# Patient Record
Sex: Male | Born: 1945
Health system: Southern US, Community
[De-identification: ages and names within clinical notes are randomized; demographics above are authoritative.]

## PROBLEM LIST (undated history)

## (undated) DIAGNOSIS — J45909 Unspecified asthma, uncomplicated: Secondary | ICD-10-CM

## (undated) DIAGNOSIS — C61 Malignant neoplasm of prostate: Secondary | ICD-10-CM

## (undated) DIAGNOSIS — M1990 Unspecified osteoarthritis, unspecified site: Secondary | ICD-10-CM

## (undated) DIAGNOSIS — J449 Chronic obstructive pulmonary disease, unspecified: Secondary | ICD-10-CM

## (undated) DIAGNOSIS — R0602 Shortness of breath: Secondary | ICD-10-CM

## (undated) DIAGNOSIS — Z923 Personal history of irradiation: Secondary | ICD-10-CM

## (undated) HISTORY — PX: OTHER SURGICAL HISTORY: SHX169

---
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
## 2012-01-04 ENCOUNTER — Other Ambulatory Visit (HOSPITAL_COMMUNITY): Payer: Self-pay | Admitting: Urology

## 2012-01-04 DIAGNOSIS — C61 Malignant neoplasm of prostate: Secondary | ICD-10-CM

## 2012-01-12 ENCOUNTER — Encounter (HOSPITAL_COMMUNITY): Payer: Self-pay

## 2012-01-12 ENCOUNTER — Encounter (HOSPITAL_COMMUNITY)
Admission: RE | Admit: 2012-01-12 | Discharge: 2012-01-12 | Disposition: A | Discharge status: Home / Self Care | Payer: Medicare Other | Source: Ambulatory Visit | Attending: Urology | Admitting: Urology

## 2012-01-12 DIAGNOSIS — C61 Malignant neoplasm of prostate: Secondary | ICD-10-CM | POA: Insufficient documentation

## 2012-01-12 HISTORY — DX: Malignant neoplasm of prostate: C61

## 2012-01-12 MED ORDER — TECHNETIUM TC 99M MEDRONATE IV KIT
24.0000 | PACK | Freq: Once | INTRAVENOUS | Status: AC | PRN
  Administered 2012-01-12: 24 via INTRAVENOUS

## 2012-02-08 ENCOUNTER — Other Ambulatory Visit: Payer: Self-pay | Admitting: Urology

## 2012-02-29 ENCOUNTER — Encounter (HOSPITAL_COMMUNITY): Payer: Self-pay | Admitting: Pharmacy Technician

## 2012-03-05 ENCOUNTER — Encounter (HOSPITAL_COMMUNITY): Payer: Self-pay

## 2012-03-05 ENCOUNTER — Encounter (HOSPITAL_COMMUNITY)
Admission: RE | Admit: 2012-03-05 | Discharge: 2012-03-05 | Disposition: A | Discharge status: Home / Self Care | Payer: Medicare Other | Source: Ambulatory Visit | Attending: Urology | Admitting: Urology

## 2012-03-05 ENCOUNTER — Ambulatory Visit (HOSPITAL_COMMUNITY)
Admission: RE | Admit: 2012-03-05 | Discharge: 2012-03-05 | Disposition: A | Discharge status: Home / Self Care | Payer: Medicare Other | Source: Ambulatory Visit | Attending: Urology | Admitting: Urology

## 2012-03-05 DIAGNOSIS — Z01812 Encounter for preprocedural laboratory examination: Secondary | ICD-10-CM | POA: Insufficient documentation

## 2012-03-05 DIAGNOSIS — Z01818 Encounter for other preprocedural examination: Secondary | ICD-10-CM | POA: Insufficient documentation

## 2012-03-05 DIAGNOSIS — Z87891 Personal history of nicotine dependence: Secondary | ICD-10-CM | POA: Insufficient documentation

## 2012-03-05 HISTORY — DX: Shortness of breath: R06.02

## 2012-03-05 HISTORY — DX: Chronic obstructive pulmonary disease, unspecified: J44.9

## 2012-03-05 HISTORY — DX: Unspecified asthma, uncomplicated: J45.909

## 2012-03-05 LAB — CBC
HCT: 44 % (ref 39.0–52.0)
Hemoglobin: 15.3 g/dL (ref 13.0–17.0)
MCHC: 34.8 g/dL (ref 30.0–36.0)
MCV: 87.8 fL (ref 78.0–100.0)

## 2012-03-05 LAB — BASIC METABOLIC PANEL
BUN: 19 mg/dL (ref 6–23)
Chloride: 103 mEq/L (ref 96–112)
Creatinine, Ser: 0.9 mg/dL (ref 0.50–1.35)
GFR calc non Af Amer: 87 mL/min — ABNORMAL LOW (ref >90)
Glucose, Bld: 105 mg/dL — ABNORMAL HIGH (ref 70–99)
Potassium: 4 mEq/L (ref 3.5–5.1)

## 2012-03-05 LAB — SURGICAL PCR SCREEN: Staphylococcus aureus: NEGATIVE

## 2012-03-05 NOTE — Progress Notes (Signed)
PT AWARE SURGERY CHANGED TO 03-06-12, NPO AFTER MIDNIGHT ARRIVE 520AM SHORT STAY

## 2012-03-05 NOTE — Patient Instructions (Signed)
YOUR SURGERY IS SCHEDULED AT [facility]  ON:  Friday  10/25  AT 1:00 PM  REPORT TO [place] SHORT STAY CENTER AT:  10:30 AM      PHONE # FOR SHORT STAY IS [phone]  FOLLOW BOWEL PREP INSTRUCTIONS FROM DR. GRAPEY'S OFFICE--DAY BEFORE YOUR SURGERY.  DO NOT EAT  ANYTHING AFTER MIDNIGHT THE NIGHT BEFORE YOUR SURGERY.  NO FOOD, NO CHEWING GUM, NO MINTS, NO CANDIES, NO CHEWING TOBACCO.  YOU MAY HAVE CLEAR LIQUIDS TO DRINK FROM MIDNIGHT THE NIGHT BEFORE YOUR SURGERY UNTIL 7:00 AM DAY OF SURGERY--SUCH AS WATER, COFFEE-NO MILK OR CREAM, CRANBERRY JUICE.         NOTHING TO DRINK AFTER 7:00 AM DAY OF SURGERY!!!!!!!!!!!  PLEASE TAKE THE FOLLOWING MEDICATIONS THE AM OF YOUR SURGERY WITH A FEW SIPS OF WATER:  USE SYMBICORT AND ALBUTEROL INHALERS AND BRING ALBUTEROL TO TAKE TO SURGERY.   IF YOU USE INHALERS--USE YOUR INHALERS THE AM OF YOUR SURGERY AND BRING INHALERS TO THE HOSPITAL -TAKE TO SURGERY.    IF YOU ARE DIABETIC:  DO NOT TAKE ANY DIABETIC MEDICATIONS THE AM OF YOUR SURGERY.  IF YOU TAKE INSULIN IN THE EVENINGS--PLEASE ONLY TAKE 1/2 NORMAL EVENING DOSE THE NIGHT BEFORE YOUR SURGERY.  NO INSULIN THE AM OF YOUR SURGERY.  IF YOU HAVE SLEEP APNEA AND USE CPAP OR BIPAP--PLEASE BRING THE MASK AND THE TUBING.  DO NOT BRING YOUR MACHINE.  DO NOT BRING VALUABLES, MONEY, CREDIT CARDS.  DO NOT WEAR JEWELRY, MAKE-UP, NAIL POLISH AND NO METAL PINS OR CLIPS IN YOUR HAIR. CONTACT LENS, DENTURES / PARTIALS, GLASSES SHOULD NOT BE WORN TO SURGERY AND IN MOST CASES-HEARING AIDS WILL NEED TO BE REMOVED.  BRING YOUR GLASSES CASE, ANY EQUIPMENT NEEDED FOR YOUR CONTACT LENS. FOR PATIENTS ADMITTED TO THE HOSPITAL--CHECK OUT TIME THE DAY OF DISCHARGE IS 11:00 AM.  ALL INPATIENT ROOMS ARE PRIVATE - WITH BATHROOM, TELEPHONE, TELEVISION AND WIFI INTERNET.  IF YOU ARE BEING DISCHARGED THE SAME DAY OF YOUR SURGERY--YOU CAN NOT DRIVE YOURSELF HOME--AND SHOULD NOT GO HOME ALONE BY TAXI OR BUS.  NO DRIVING OR  OPERATING MACHINERY FOR 24 HOURS FOLLOWING ANESTHESIA / PAIN MEDICATIONS.  PLEASE MAKE ARRANGEMENTS FOR SOMEONE TO BE WITH YOU AT HOME THE FIRST 24 HOURS AFTER SURGERY. RESPONSIBLE DRIVER'S NAME___________________________                                               PHONE #   _______________________                                  PLEASE READ OVER ANY  FACT SHEETS THAT YOU WERE GIVEN: MRSA INFORMATION, BLOOD TRANSFUSION INFORMATION, INCENTIVE SPIROMETER INFORMATION.

## 2012-03-05 NOTE — Pre-Procedure Instructions (Signed)
CBC, BMET, T/S, EKG AND CXR WERE DONE PREOP TODAY AT [facility] AS PER ORDERS DR. Isabel Caprice AND ANESTHESIOLOGIST'S GUIDELINES.

## 2012-03-06 ENCOUNTER — Encounter (HOSPITAL_COMMUNITY)
Admission: RE | Disposition: A | Discharge status: Home / Self Care | Payer: Self-pay | Source: Ambulatory Visit | Attending: Urology

## 2012-03-06 ENCOUNTER — Inpatient Hospital Stay (HOSPITAL_COMMUNITY): Payer: Medicare Other | Admitting: Anesthesiology

## 2012-03-06 ENCOUNTER — Observation Stay (HOSPITAL_COMMUNITY)
Admission: RE | Admit: 2012-03-06 | Discharge: 2012-03-07 | Disposition: A | Discharge status: Home / Self Care | Payer: Medicare Other | Source: Ambulatory Visit | Attending: Urology | Admitting: Urology

## 2012-03-06 ENCOUNTER — Encounter (HOSPITAL_COMMUNITY): Payer: Self-pay | Admitting: *Deleted

## 2012-03-06 ENCOUNTER — Encounter (HOSPITAL_COMMUNITY): Payer: Self-pay | Admitting: Anesthesiology

## 2012-03-06 DIAGNOSIS — Z8042 Family history of malignant neoplasm of prostate: Secondary | ICD-10-CM | POA: Insufficient documentation

## 2012-03-06 DIAGNOSIS — J45909 Unspecified asthma, uncomplicated: Secondary | ICD-10-CM | POA: Insufficient documentation

## 2012-03-06 DIAGNOSIS — Z79899 Other long term (current) drug therapy: Secondary | ICD-10-CM | POA: Insufficient documentation

## 2012-03-06 DIAGNOSIS — C61 Malignant neoplasm of prostate: Principal | ICD-10-CM | POA: Insufficient documentation

## 2012-03-06 DIAGNOSIS — IMO0002 Reserved for concepts with insufficient information to code with codable children: Secondary | ICD-10-CM | POA: Insufficient documentation

## 2012-03-06 DIAGNOSIS — Z01812 Encounter for preprocedural laboratory examination: Secondary | ICD-10-CM | POA: Insufficient documentation

## 2012-03-06 HISTORY — PX: ROBOT ASSISTED LAPAROSCOPIC RADICAL PROSTATECTOMY: SHX5141

## 2012-03-06 HISTORY — PX: CYSTOSCOPY: SHX5120

## 2012-03-06 LAB — TYPE AND SCREEN
ABO/RH(D): O POS
Antibody Screen: NEGATIVE

## 2012-03-06 SURGERY — ROBOTIC ASSISTED LAPAROSCOPIC RADICAL PROSTATECTOMY
Anesthesia: General | Wound class: Clean Contaminated

## 2012-03-06 MED ORDER — ONDANSETRON HCL 4 MG/2ML IJ SOLN
INTRAMUSCULAR | Status: DC | PRN
  Administered 2012-03-06: 4 mg via INTRAVENOUS

## 2012-03-06 MED ORDER — STERILE WATER FOR IRRIGATION IR SOLN
Status: DC | PRN
  Administered 2012-03-06: 3000 mL

## 2012-03-06 MED ORDER — CEFAZOLIN SODIUM-DEXTROSE 2-3 GM-% IV SOLR
2.0000 g | INTRAVENOUS | Status: AC
  Administered 2012-03-06: 2 g via INTRAVENOUS

## 2012-03-06 MED ORDER — PROPOFOL 10 MG/ML IV BOLUS
INTRAVENOUS | Status: DC | PRN
  Administered 2012-03-06: 200 mg via INTRAVENOUS

## 2012-03-06 MED ORDER — GLYCOPYRROLATE 0.2 MG/ML IJ SOLN
INTRAMUSCULAR | Status: DC | PRN
  Administered 2012-03-06: 0.4 mg via INTRAVENOUS

## 2012-03-06 MED ORDER — ACETAMINOPHEN 10 MG/ML IV SOLN
INTRAVENOUS | Status: AC
  Filled 2012-03-06: qty 100

## 2012-03-06 MED ORDER — HEPARIN SODIUM (PORCINE) 1000 UNIT/ML IJ SOLN
INTRAMUSCULAR | Status: AC
  Filled 2012-03-06: qty 1

## 2012-03-06 MED ORDER — SODIUM CHLORIDE 0.9 % IV SOLN
INTRAVENOUS | Status: DC | PRN
  Administered 2012-03-06: 12:00:00 via INTRAVENOUS

## 2012-03-06 MED ORDER — ROCURONIUM BROMIDE 100 MG/10ML IV SOLN
INTRAVENOUS | Status: DC | PRN
  Administered 2012-03-06: 20 mg via INTRAVENOUS
  Administered 2012-03-06: 50 mg via INTRAVENOUS
  Administered 2012-03-06: 10 mg via INTRAVENOUS
  Administered 2012-03-06: 20 mg via INTRAVENOUS

## 2012-03-06 MED ORDER — LACTATED RINGERS IV SOLN
INTRAVENOUS | Status: DC | PRN
  Administered 2012-03-06: 10:00:00

## 2012-03-06 MED ORDER — ACETAMINOPHEN 325 MG PO TABS: 650.0000 mg | ORAL_TABLET | Freq: Four times a day (QID) | ORAL | Status: DC | PRN

## 2012-03-06 MED ORDER — HYDROCODONE-ACETAMINOPHEN 5-325 MG PO TABS
1.0000 | ORAL_TABLET | ORAL | Status: DC | PRN
Start: 2012-03-06 — End: 2012-03-07
  Administered 2012-03-06: 2 via ORAL
  Administered 2012-03-06: 1 via ORAL
  Filled 2012-03-06: qty 1
  Filled 2012-03-06: qty 2

## 2012-03-06 MED ORDER — CIPROFLOXACIN HCL 500 MG PO TABS: 500.0000 mg | ORAL_TABLET | Freq: Two times a day (BID) | ORAL | Status: DC

## 2012-03-06 MED ORDER — BUPIVACAINE-EPINEPHRINE PF 0.25-1:200000 % IJ SOLN
INTRAMUSCULAR | Status: AC
  Filled 2012-03-06: qty 30

## 2012-03-06 MED ORDER — BUDESONIDE-FORMOTEROL FUMARATE 160-4.5 MCG/ACT IN AERO
2.0000 | INHALATION_SPRAY | Freq: Two times a day (BID) | RESPIRATORY_TRACT | Status: DC
  Administered 2012-03-06 – 2012-03-07 (×2): 2 via RESPIRATORY_TRACT
  Filled 2012-03-06: qty 6

## 2012-03-06 MED ORDER — ACETAMINOPHEN 10 MG/ML IV SOLN: 1000.0000 mg | Freq: Once | INTRAVENOUS | Status: DC | PRN

## 2012-03-06 MED ORDER — OXYCODONE HCL 5 MG/5ML PO SOLN
5.0000 mg | Freq: Once | ORAL | Status: DC | PRN
  Filled 2012-03-06: qty 5

## 2012-03-06 MED ORDER — ACETAMINOPHEN 10 MG/ML IV SOLN
1000.0000 mg | Freq: Four times a day (QID) | INTRAVENOUS | Status: DC
  Administered 2012-03-06 – 2012-03-07 (×3): 1000 mg via INTRAVENOUS
  Filled 2012-03-06 (×4): qty 100

## 2012-03-06 MED ORDER — ACETAMINOPHEN 10 MG/ML IV SOLN
INTRAVENOUS | Status: DC | PRN
  Administered 2012-03-06: 1000 mg via INTRAVENOUS

## 2012-03-06 MED ORDER — MIDAZOLAM HCL 5 MG/5ML IJ SOLN
INTRAMUSCULAR | Status: DC | PRN
  Administered 2012-03-06: 2 mg via INTRAVENOUS

## 2012-03-06 MED ORDER — ALBUTEROL SULFATE HFA 108 (90 BASE) MCG/ACT IN AERS
2.0000 | INHALATION_SPRAY | Freq: Four times a day (QID) | RESPIRATORY_TRACT | Status: DC | PRN
  Administered 2012-03-06: 2 via RESPIRATORY_TRACT
  Filled 2012-03-06: qty 6.7

## 2012-03-06 MED ORDER — HYDROMORPHONE HCL PF 1 MG/ML IJ SOLN
INTRAMUSCULAR | Status: AC
  Filled 2012-03-06: qty 1

## 2012-03-06 MED ORDER — HYDROMORPHONE HCL PF 1 MG/ML IJ SOLN
0.2500 mg | INTRAMUSCULAR | Status: DC | PRN
  Administered 2012-03-06 (×2): 0.5 mg via INTRAVENOUS

## 2012-03-06 MED ORDER — SODIUM CHLORIDE 0.9 % IV BOLUS (SEPSIS)
1000.0000 mL | Freq: Once | INTRAVENOUS | Status: AC
  Administered 2012-03-06: 1000 mL via INTRAVENOUS

## 2012-03-06 MED ORDER — PROMETHAZINE HCL 25 MG/ML IJ SOLN: 6.2500 mg | INTRAMUSCULAR | Status: DC | PRN

## 2012-03-06 MED ORDER — BUPIVACAINE-EPINEPHRINE 0.25% -1:200000 IJ SOLN
INTRAMUSCULAR | Status: DC | PRN
  Administered 2012-03-06: 28 mL

## 2012-03-06 MED ORDER — NEOSTIGMINE METHYLSULFATE 1 MG/ML IJ SOLN
INTRAMUSCULAR | Status: DC | PRN
  Administered 2012-03-06: 3.5 mg via INTRAVENOUS

## 2012-03-06 MED ORDER — INDIGOTINDISULFONATE SODIUM 8 MG/ML IJ SOLN
INTRAMUSCULAR | Status: AC
  Filled 2012-03-06: qty 10

## 2012-03-06 MED ORDER — KCL IN DEXTROSE-NACL 10-5-0.45 MEQ/L-%-% IV SOLN
INTRAVENOUS | Status: AC
  Filled 2012-03-06: qty 1000

## 2012-03-06 MED ORDER — OXYCODONE HCL 5 MG PO TABS: 5.0000 mg | ORAL_TABLET | Freq: Once | ORAL | Status: DC | PRN

## 2012-03-06 MED ORDER — MEPERIDINE HCL 50 MG/ML IJ SOLN: 6.2500 mg | INTRAMUSCULAR | Status: DC | PRN

## 2012-03-06 MED ORDER — KCL IN DEXTROSE-NACL 10-5-0.45 MEQ/L-%-% IV SOLN
INTRAVENOUS | Status: DC
  Administered 2012-03-06: 21:00:00 via INTRAVENOUS
  Administered 2012-03-06: 1000 mL via INTRAVENOUS
  Filled 2012-03-06 (×5): qty 1000

## 2012-03-06 MED ORDER — INDIGOTINDISULFONATE SODIUM 8 MG/ML IJ SOLN
INTRAMUSCULAR | Status: DC | PRN
  Administered 2012-03-06 (×2): 5 mL via INTRAVENOUS

## 2012-03-06 MED ORDER — FENTANYL CITRATE 0.05 MG/ML IJ SOLN
INTRAMUSCULAR | Status: DC | PRN
  Administered 2012-03-06 (×2): 100 ug via INTRAVENOUS
  Administered 2012-03-06: 150 ug via INTRAVENOUS

## 2012-03-06 MED ORDER — CEFAZOLIN SODIUM-DEXTROSE 2-3 GM-% IV SOLR
INTRAVENOUS | Status: AC
  Filled 2012-03-06: qty 50

## 2012-03-06 MED ORDER — LIDOCAINE HCL (CARDIAC) 20 MG/ML IV SOLN
INTRAVENOUS | Status: DC | PRN
  Administered 2012-03-06: 50 mg via INTRAVENOUS

## 2012-03-06 MED ORDER — STERILE WATER FOR IRRIGATION IR SOLN
Status: DC | PRN
  Administered 2012-03-06: 6000 mL

## 2012-03-06 MED ORDER — HYDROMORPHONE HCL PF 1 MG/ML IJ SOLN
INTRAMUSCULAR | Status: DC | PRN
  Administered 2012-03-06 (×2): 1 mg via INTRAVENOUS

## 2012-03-06 MED ORDER — HYDROCODONE-ACETAMINOPHEN 5-325 MG PO TABS: 1.0000 | ORAL_TABLET | Freq: Four times a day (QID) | ORAL | Status: DC | PRN

## 2012-03-06 MED ORDER — MORPHINE SULFATE 2 MG/ML IJ SOLN: 2.0000 mg | INTRAMUSCULAR | Status: DC | PRN

## 2012-03-06 MED ORDER — LACTATED RINGERS IV SOLN
INTRAVENOUS | Status: DC | PRN
  Administered 2012-03-06 (×2): via INTRAVENOUS

## 2012-03-06 MED ORDER — HEMOSTATIC AGENTS (NO CHARGE) OPTIME
TOPICAL | Status: DC | PRN
  Administered 2012-03-06: 1 via TOPICAL

## 2012-03-06 SURGICAL SUPPLY — 64 items
ADAPTER CATH URET PLST 4-6FR (CATHETERS) IMPLANT
ADPR CATH URET STRL DISP 4-6FR (CATHETERS)
APPLICATOR SURGIFLO ENDO (HEMOSTASIS) ×3 IMPLANT
BAG URO CATCHER STRL LF (DRAPE) IMPLANT
CANISTER SUCTION 2500CC (MISCELLANEOUS) ×3 IMPLANT
CATH FOLEY 2W COUNCIL 5CC 16FR (CATHETERS) ×3 IMPLANT
CATH FOLEY 2WAY 5CC 16FR (CATHETERS) ×2
CATH FOLEY 2WAY SLVR  5CC 20FR (CATHETERS) ×1
CATH FOLEY 2WAY SLVR 18FR 30CC (CATHETERS) ×3 IMPLANT
CATH FOLEY 2WAY SLVR 5CC 20FR (CATHETERS) ×2 IMPLANT
CATH ROBINSON RED A/P 8FR (CATHETERS) ×3 IMPLANT
CATH TIEMANN FOLEY 18FR 5CC (CATHETERS) ×3 IMPLANT
CATH URET 5FR 28IN CONE TIP (BALLOONS)
CATH URET 5FR 70CM CONE TIP (BALLOONS) IMPLANT
CATH URET WHISTLE 5FR 28IN (CATHETERS) IMPLANT
CATH URET WHISTLE 6FR (CATHETERS) IMPLANT
CATH URTH STD 16FR FL 2W DRN (CATHETERS) ×2 IMPLANT
CHLORAPREP W/TINT 26ML (MISCELLANEOUS) ×3 IMPLANT
CLIP LIGATING HEM O LOK PURPLE (MISCELLANEOUS) IMPLANT
CLOTH BEACON ORANGE TIMEOUT ST (SAFETY) ×3 IMPLANT
CORD HIGH FREQUENCY UNIPOLAR (ELECTROSURGICAL) ×3 IMPLANT
COVER SURGICAL LIGHT HANDLE (MISCELLANEOUS) ×3 IMPLANT
COVER TIP SHEARS 8 DVNC (MISCELLANEOUS) ×2 IMPLANT
COVER TIP SHEARS 8MM DA VINCI (MISCELLANEOUS) ×1
CUTTER ECHEON FLEX ENDO 45 340 (ENDOMECHANICALS) ×3 IMPLANT
DECANTER SPIKE VIAL GLASS SM (MISCELLANEOUS) ×3 IMPLANT
DRAPE SURG IRRIG POUCH 19X23 (DRAPES) ×3 IMPLANT
DRAPE UTILITY 15X26 (DRAPE) ×3 IMPLANT
DRSG TEGADERM 2-3/8X2-3/4 SM (GAUZE/BANDAGES/DRESSINGS) ×15 IMPLANT
DRSG TEGADERM 4X4.75 (GAUZE/BANDAGES/DRESSINGS) ×3 IMPLANT
DRSG TEGADERM 6X8 (GAUZE/BANDAGES/DRESSINGS) ×6 IMPLANT
ELECT REM PT RETURN 9FT ADLT (ELECTROSURGICAL) ×3
ELECTRODE REM PT RTRN 9FT ADLT (ELECTROSURGICAL) ×2 IMPLANT
GLOVE BIO SURGEON STRL SZ 6.5 (GLOVE) ×6 IMPLANT
GLOVE BIOGEL M STRL SZ7.5 (GLOVE) ×3 IMPLANT
GOWN PREVENTION PLUS XLARGE (GOWN DISPOSABLE) ×6 IMPLANT
GOWN STRL NON-REIN LRG LVL3 (GOWN DISPOSABLE) ×9 IMPLANT
GOWN STRL REIN XL XLG (GOWN DISPOSABLE) ×3 IMPLANT
GUIDEWIRE STR DUAL SENSOR (WIRE) ×6 IMPLANT
HEMOSTAT SURGICEL 4X8 (HEMOSTASIS) ×3 IMPLANT
HOLDER FOLEY CATH W/STRAP (MISCELLANEOUS) ×3 IMPLANT
IV LACTATED RINGERS 1000ML (IV SOLUTION) IMPLANT
KIT ACCESSORY DA VINCI DISP (KITS) ×1
KIT ACCESSORY DVNC DISP (KITS) ×2 IMPLANT
MANIFOLD NEPTUNE II (INSTRUMENTS) IMPLANT
NDL SAFETY ECLIPSE 18X1.5 (NEEDLE) ×2 IMPLANT
NEEDLE HYPO 18GX1.5 SHARP (NEEDLE) ×1
PACK CYSTO (CUSTOM PROCEDURE TRAY) IMPLANT
PACK ROBOT UROLOGY CUSTOM (CUSTOM PROCEDURE TRAY) ×3 IMPLANT
RELOAD GREEN ECHELON 45 (STAPLE) ×3 IMPLANT
SEALER TISSUE G2 CVD JAW 45CM (ENDOMECHANICALS) ×3 IMPLANT
SET CYSTO W/LG BORE CLAMP LF (SET/KITS/TRAYS/PACK) ×6 IMPLANT
SET TUBE IRRIG SUCTION NO TIP (IRRIGATION / IRRIGATOR) ×3 IMPLANT
SOLUTION ELECTROLUBE (MISCELLANEOUS) ×3 IMPLANT
SPONGE GAUZE 4X4 12PLY (GAUZE/BANDAGES/DRESSINGS) ×3 IMPLANT
SURGIFLO W/THROMBIN 8M KIT (HEMOSTASIS) ×3 IMPLANT
SUT VIC AB 2-0 SH 27 (SUTURE) ×1
SUT VIC AB 2-0 SH 27X BRD (SUTURE) ×2 IMPLANT
SUT VICRYL 0 UR6 27IN ABS (SUTURE) ×3 IMPLANT
SYR 27GX1/2 1ML LL SAFETY (SYRINGE) ×3 IMPLANT
TOWEL OR NON WOVEN STRL DISP B (DISPOSABLE) ×3 IMPLANT
TUBING CONNECTING 10 (TUBING) IMPLANT
WATER STERILE IRR 1500ML POUR (IV SOLUTION) IMPLANT
WIRE COONS/BENSON .038X145CM (WIRE) IMPLANT

## 2012-03-06 NOTE — Anesthesia Preprocedure Evaluation (Addendum)
Anesthesia Evaluation  Patient identified by MRN, date of birth, ID band Patient awake    Reviewed: Allergy & Precautions, H&P , NPO status , Patient's Chart, lab work & pertinent test results  Airway Mallampati: I TM Distance: >3 FB Neck ROM: Full    Dental  (+) Dental Advisory Given and Teeth Intact   Pulmonary shortness of breath and with exertion, asthma , COPD COPD inhaler, former smoker,  breath sounds clear to auscultation  Pulmonary exam normal       Cardiovascular - CAD and - Past MI Rhythm:Regular Rate:Normal     Neuro/Psych negative neurological ROS  negative psych ROS   GI/Hepatic Neg liver ROS,   Endo/Other  negative endocrine ROS  Renal/GU negative Renal ROS     Musculoskeletal negative musculoskeletal ROS (+)   Abdominal   Peds  Hematology negative hematology ROS (+)   Anesthesia Other Findings   Reproductive/Obstetrics                         Anesthesia Physical Anesthesia Plan  ASA: III  Anesthesia Plan: General   Post-op Pain Management:    Induction: Intravenous  Airway Management Planned: Oral ETT  Additional Equipment:   Intra-op Plan:   Post-operative Plan:   Informed Consent: I have reviewed the patients History and Physical, chart, labs and discussed the procedure including the risks, benefits and alternatives for the proposed anesthesia with the patient or authorized representative who has indicated his/her understanding and acceptance.   Dental advisory given  Plan Discussed with: CRNA  Anesthesia Plan Comments:         Anesthesia Quick Evaluation

## 2012-03-06 NOTE — Op Note (Signed)
Preoperative diagnosis: Clinical stage T1c Adenocarcinoma prostate  Postoperative diagnosis: Same, urethral stricture disease Procedure: Cystoscopy with urethral dilation, Robotic-assisted laparoscopic radical retropubic prostatectomy with bilateral pelvic lymph node dissection  Surgeon: Valetta Fuller, MD  Asst.: Pecola Leisure, PA Anesthesia: Gen. Endotracheal  Indications: Patient was diagnosed with clinical stage TIc Adenocarcinoma the prostate. He underwent extensive consultation with regard to treatment options. The patient decided on a surgical approach. He appeared to understand the distinct advantages as well as the disadvantages of this procedure. The patient has performed a mechanical bowel prep. He has had placement of PAS compression boots and has received perioperative antibiotics. The patient's preoperative PSA was 4-5 range. Ultrasound revealed a 28 g prostate.  The patient had a history of urethral stricture disease but did not have any major voiding complaints. We felt that cystoscopy prior to the procedure to assess the situation would be indicated. Technique and findings:The patient was brought to the operating room and had successful induction of general endotracheal anesthesia.the patient was placed in a low lithotomy position with careful padding of all extremities. He was secured to the operative table and placed in the steep Trendelenburg position. He was prepped and draped in usual manner. We went ahead with flexible cystoscopy. This revealed several areas of narrowing with mild stricture disease within the penile urethra and a moderate stricture of approximately 12 French in the bulbar urethra. A wire was placed into the bladder. Dilators were then utilized over the guidewire. We then eventually passed a 16 [place] council tip Foley catheter into the bladder. A Foley catheter was placed sterilely on the field. Camera port site was chosen 18 cm above the pubic symphysis just to the  left of the umbilicus. A standard open Hassan technique was utilized. A 12 mm trocar was placed without difficulty. The camera was then inserted and no abnormalities were noted within the pelvis. The trochars were placed with direct visual guidance. This included 3 8mm robotic trochars and a 12 mm and 5 mm assist ports. Once all the ports were placed the robot was docked. The bladder was filled and the space of Retzius was developed with electrocautery dissection as well as blunt dissection. Superficial fat off the endopelvic fascia and bladder neck was removed with electrocautery scissors. The endopelvic fascia was then incised bilaterally from base to apex. Levator musculature was swept off the apex of the prostate isolating the dorsal venous complex which was then stapled with the ETS stapling device. The anterior bladder neck was identified with the aid of the Foley balloon. This was then transected down to the Foley catheter with electrocautery scissors. The Foley catheter was then retracted anteriorly. Indigo carmine was given and we appeared to be well away from the ureteral orifices. The posterior bladder neck was then transected and the dissection carried down to the adnexal structures. The seminal vesicles and vas deferens on both sides were then individually dissected free and retracted anteriorly. The posterior plane between the rectum and prostate was then established primarily with blunt dissection.  Attention was then turned towards nerve sparing. The patient was felt to be a candidate for left-sided nerve sparing and limited right-sided nerve sparing. Superficial fascia along the anterior lateral aspect of the prostate was incised bilaterally. This tissue was then swept laterally until we were able to establish a groove between the neurovascular tissue and the posterior lateral aspect on the prostate bilaterally. This groove was then extended from the apex back to the base of the prostate.  With the  prostate retracted anteriorly the vascular pedicles of the prostate were taken with the Enseal device. The Foley catheter was then reinserted and the anterior urethra was transected. The posterior urethra was then transected as were some rectourethralis fibers. The prostate was then removed from the pelvis. The pelvis was then copiously irrigated. Rectal insufflation was performed and there was no evidence of rectal injury.  Attention was then turned towards bilateral pelvic lymph node dissection. The obturator node packets were removed I laterally and the dissection extended towards the bifurcation of the iliac artery. The obturator nerve was identified on both sides and preserved. Hemalock clips were used for small veins and lymphatic channels. The node packets were sent for permanent analysis.  Attention was then turned towards reconstruction. The bladder neck did not require any reconstruction. The bladder neck and posterior urethra were reapproximated at the 6:00 position utilizing a 2-0 Vicryl suture. The rest of the anastomosis was done with a double-armed 3-0 Monocryl suture in a 360 degree manner. Additional indigo carmine was given. The catheter was reinserted into the bladder and bladder irrigation revealed no evidence of leakage. A Blake drain was placed through one of the robotic trochars and positioned in the retropubic space above the anastomosis. This was then secured to the skin with a nylon suture. The prostate was placed in the Endopouch retrieval bag. The 12 mm trocar site was closed with a Vicryl suture with the aid of a suture passer. Our other trochars were taken out with direct visual guidance without evidence of any bleeding. The camera port incision was extended slightly to allow for removal of the specimen and then closed with a running Vicryl suture. All port sites were infiltrated with Marcaine and then closed with surgical clips. The patient was then taken to recovery room having had  no obvious complications or problems. Sponge and needle counts were correct.

## 2012-03-06 NOTE — Preoperative (Signed)
Beta Blockers   Reason not to administer Beta Blockers:Not Applicable 

## 2012-03-06 NOTE — Care Management Note (Unsigned)
    Page 1 of 1   03/06/2012     2:28:01 PM   CARE MANAGEMENT NOTE 03/06/2012  Patient:  Curtis [name]   Account Number:  [id number]  Date Initiated:  03/06/2012  Documentation initiated by:  Lanier Clam  Subjective/Objective Assessment:   ADMITTED W/URETHRAL STRICTURE.     Action/Plan:   FROM HOME W/SPOUSE   Anticipated DC Date:  03/07/2012   Anticipated DC Plan:  HOME/SELF CARE      DC Planning Services  CM consult      Choice offered to / List presented to:             Status of service:  In process, will continue to follow Medicare Important Message given?   (If response is "NO", the following Medicare IM given date fields will be blank) Date Medicare IM given:   Date Additional Medicare IM given:    Discharge Disposition:    Per UR Regulation:  Reviewed for med. necessity/level of care/duration of stay  If discussed at Long Length of Stay Meetings, dates discussed:    Comments:  03/06/12 Curtis Stanger RN,BSN NCM [phone] [name]/P LAP RADICAL PROSTATECTOMY

## 2012-03-06 NOTE — Anesthesia Postprocedure Evaluation (Signed)
Anesthesia Post Note  Patient: Curtis [name]  Procedure(s) Performed: Procedure(s) (LRB): ROBOTIC ASSISTED LAPAROSCOPIC RADICAL PROSTATECTOMY (N/A) LYMPHADENECTOMY (Bilateral) CYSTOSCOPY FLEXIBLE (N/A)  Anesthesia type: General  Patient location: PACU  Post pain: Pain level controlled  Post assessment: Post-op Vital signs reviewed  Last Vitals:  Filed Vitals:   03/06/12 1345  BP: 140/61  Pulse: 52  Temp: 36.3 C  Resp: 13    Post vital signs: Reviewed  Level of consciousness: sedated  Complications: No apparent anesthesia complications

## 2012-03-06 NOTE — OR Nursing (Signed)
Late entry: Jackson-Pratt drain documented. 03/06/2012 @1408 

## 2012-03-06 NOTE — Anesthesia Postprocedure Evaluation (Signed)
Anesthesia Post Note  Patient: Curtis [name]  Procedure(s) Performed: Procedure(s) (LRB): ROBOTIC ASSISTED LAPAROSCOPIC RADICAL PROSTATECTOMY (N/A) LYMPHADENECTOMY (Bilateral) CYSTOSCOPY FLEXIBLE (N/A)  Anesthesia type: General  Patient location: PACU  Post pain: Pain level controlled  Post assessment: Post-op Vital signs reviewed  Last Vitals: BP 111/61  Pulse 59  Temp 36.2 C (Oral)  Resp 12  SpO2 99%  Post vital signs: Reviewed  Level of consciousness: sedated  Complications: No apparent anesthesia complications

## 2012-03-06 NOTE — Transfer of Care (Signed)
Immediate Anesthesia Transfer of Care Note  Patient: Curtis [name]  Procedure(s) Performed: Procedure(s) (LRB) with comments: ROBOTIC ASSISTED LAPAROSCOPIC RADICAL PROSTATECTOMY (N/A) - WITH BILATERAL PELVIC LYMPH NODE DISSECTION LYMPHADENECTOMY (Bilateral) CYSTOSCOPY FLEXIBLE (N/A) - with urethral dialation  Patient Location: PACU  Anesthesia Type: General  Level of Consciousness: awake, alert  and patient cooperative  Airway & Oxygen Therapy: Patient Spontanous Breathing and Patient connected to face mask oxygen  Post-op Assessment: Report given to PACU RN and Post -op Vital signs reviewed and stable  Post vital signs: Reviewed and stable  Complications: No apparent anesthesia complications

## 2012-03-06 NOTE — Progress Notes (Signed)
Pt would only take 1 puff of symbicort.

## 2012-03-06 NOTE — H&P (Signed)
Reason For Visit     Patient is a 66 year old male sent to me to discuss the pros and cons of possible robotic prostatectomy to manage recently diagnosed intermediate risk clinical stage TI C prostate cancer. AUA score =4. SHIM 20/25. No abdominal surgery. History of urethral stricture.   History of Present Illness  Prior history per Dr. Ihor Gully     Adenocarcinoma of the prostate: He was found to have a PSA of 4.18 in 7/11. He had his PSA checked in 3/13 because he was applying for some insurance and was found to be elevated further at 7.35. He has no voiding symptoms and reports he voids with a good stream which is significant because he has had a prior visual internal urethrotomy by Dr. Aldean Ast in the early 90s. At that time he was experiencing some pain in his left testicle. He was having some left testicular pain for about 2 months. TRUS/BX was performed on 12/25/11 do to an elevated PSA of 7.27/5%. This revealed 28 cc prostate. Pathology: Adenocarcinoma 8/12 cores positive. Gleason 4+4 = 8 in one core, 4+3 = 7 in 2 cores and 3+4 = 7 in the remainder. Stage: cT1c Metastatic workup: His bone scan and CT scan of the pelvis were negative for evidence of metastatic disease.   Interval history: He is doing well and came in today to go over the results of his metastatic workup.   Past Medical History Problems  1. History of  Asthma 493.90  Surgical History Problems  1. History of  Biopsy Of The Prostate Needle 2. History of  Dilation Of Male Urethral Stricture 3. History of  Primary Repair Of Ruptured Achilles Tendon Right  Current Meds 1. Albuterol Sulfate NEBU; Therapy: (Recorded:22Aug2013) to 2. Aspirin 325 MG Oral Tablet; Therapy: (Recorded:25Jun2013) to 3. ProAir HFA AERS; Therapy: (Recorded:25Jun2013) to 4. Symbicort 160-4.5 MCG/ACT Inhalation Aerosol; Therapy: (Recorded:25Jun2013) to  Allergies Medication  1. Penicillins  Family History Problems  1. Family history  of  Prostate Cancer V16.42  Social History Problems  1. Alcohol Use 2. Caffeine Use 3. Former Smoker V15.82 1/2ppdx20 years ago quit 15 years ago around 1998  Review of Systems  Respiratory: shortness of breath.  Musculoskeletal: joint pain.    Vitals Vital Signs [Data Includes: Last 1 Day]  24Sep2013 04:22PM  Blood Pressure: 124 / 76 Temperature: 98 F Heart Rate: 66  Physical Exam Constitutional: Well nourished and well developed . No acute distress.  Pulmonary: No respiratory distress and normal respiratory rhythm and effort.  Cardiovascular: Heart rate and rhythm are normal . No peripheral edema.  Abdomen: The abdomen is soft and nontender. No masses are palpated. No CVA tenderness. No hernias are palpable. No hepatosplenomegaly noted.  Neuro/Psych:. Mood and affect are appropriate.    Assessment Assessed  1. Adenocarcinoma Of The Prostate Gland 185  Plan Adenocarcinoma Of The Prostate Gland (185)  1. Follow-up Schedule Surgery Office  Follow-up  Requested for: 24Sep2013  Discussion/Summary  A total of 50 minutes were spent in the overall care of the patient today with 45 minutes in direct face to face consultation.   The patient was counseled about the natural history of prostate cancer and the standard treatment options that are available for prostate cancer. It was explained to him how his age and life expectancy, clinical stage, Gleason score, and PSA affect his prognosis, the decision to proceed with additional staging studies, as well as how that information influences recommended treatment strategies. We discussed the roles for active  surveillance, radiation therapy, surgical therapy, androgen deprivation, as well as ablative therapy options for the treatment of prostate cancer as appropriate to his individual cancer situation. We discussed the risks and benefits of these options with regard to their impact on cancer control and also in terms of potential adverse  events, complications, and impact on quiality of life particularly related to urinary, bowel, and sexual function. The patient was encouraged to ask questions throughout the discussion today and all questions were answered to his stated satisfaction. In addition, the patient was provided with and/or directed to appropriate resources and literature for further education about prostate cancer and treatment options.   We discussed surgical therapy for prostate cancer including the different available surgical approaches. We discussed, in detail, the risks and expectations of surgery with regard to cancer control, urinary control, and erectile function as well as the expected postoperative recovery process. The risks, potential complications/adverse events of radical prostatectomy as well as alternative options were explained to the patient.   We discussed surgical therapy for prostate cancer including the different available surgical approaches. We discussed, in detail, the risks and expectations of surgery with regard to cancer control, urinary control, and erectile function as well as the expected postoperative recovery process. Additional risks of surgery including but not limited to bleeding, infection, hernia formation, nerve damage, lymphocele formation, bowel/rectal injury potentially necessitating colostomy, damage to the urinary tract resulting in urine leakage, urethral stricture, and the cardiopulmonary risks such as myocardial infarction, stroke, death, venothromboembolism, etc. were explained. The risk of open surgical conversion for robotic/laparoscopic prostatectomy was also discussed.   45 minutes were spent in face to face consultation with patient today.    Amendment  patient like to proceed with robotic prostatectomy. I do think we should perform flexible cystoscopy prior to starting the procedure to rule out a significant urethral narrowing given his history. He would be prudent for him to  discuss with Dr. [name] whether he should undergo any preoperative pulmonary function studies or reassessment with a new pulmonologist.

## 2012-03-07 LAB — BASIC METABOLIC PANEL
BUN: 9 mg/dL (ref 6–23)
CO2: 23 mEq/L (ref 19–32)
Chloride: 103 mEq/L (ref 96–112)
Creatinine, Ser: 0.84 mg/dL (ref 0.50–1.35)
Glucose, Bld: 119 mg/dL — ABNORMAL HIGH (ref 70–99)

## 2012-03-07 MED ORDER — OXYCODONE HCL 5 MG PO TABS: 5.0000 mg | ORAL_TABLET | ORAL | Status: DC | PRN

## 2012-03-07 MED ORDER — ALBUTEROL SULFATE HFA 108 (90 BASE) MCG/ACT IN AERS: 2.0000 | INHALATION_SPRAY | Freq: Four times a day (QID) | RESPIRATORY_TRACT | Status: DC | PRN

## 2012-03-07 MED ORDER — BISACODYL 10 MG RE SUPP
10.0000 mg | Freq: Once | RECTAL | Status: AC
  Administered 2012-03-07: 10 mg via RECTAL
  Filled 2012-03-07: qty 1

## 2012-03-07 MED ORDER — CIPROFLOXACIN HCL 500 MG PO TABS: 500.0000 mg | ORAL_TABLET | Freq: Two times a day (BID) | ORAL | Status: DC

## 2012-03-07 NOTE — Progress Notes (Signed)
1 Day Post-Op Subjective: Patient reports pain control good. No N/V.  Ambulating well  Objective: Vital signs in last 24 hours: Temp:  [97.2 F (36.2 C)-99.1 F (37.3 C)] 99.1 F (37.3 C) (10/24 0459) Pulse Rate:  [52-77] 77  (10/24 0459) Resp:  [6-16] 16  (10/24 0459) BP: (102-217)/(52-104) 104/52 mmHg (10/24 0459) SpO2:  [95 %-100 %] 98 % (10/24 0459) Weight:  [91.627 kg (202 lb)] 91.627 kg (202 lb) (10/23 1426)  Intake/Output from previous day: 10/23 0701 - 10/24 0700 In: 5770 [P.O.:420; I.V.:5150; IV Piggyback:200] Out: 2765 [Urine:2475; Drains:155; Blood:100] Intake/Output this shift:    Physical Exam:  General:alert, cooperative and no distress Cardiovascular: RRR Lungs: course BS bilat bases GI: soft, approp tender, normal bowel sounds, no palpable masses,  Incisions: c/d/i Urine: yellow Extremities: warm with SCDs in place  Lab Results:  Basename 03/07/12 0432 03/06/12 1248 03/05/12 0930  HGB 11.7* 13.6 15.3  HCT 33.9* 39.8 44.0   BMET  Basename 03/07/12 0432 03/05/12 0930  NA 134* 138  K 3.7 4.0  CL 103 103  CO2 23 25  GLUCOSE 119* 105*  BUN 9 19  CREATININE 0.84 0.90  CALCIUM 7.5* 9.1   No results found for this basename: LABPT:3,INR:3 in the last 72 hours No results found for this basename: LABURIN:1 in the last 72 hours Results for orders placed during the hospital encounter of 03/05/12  SURGICAL PCR SCREEN     Status: Normal   Collection Time   03/05/12  8:43 AM      Component Value Range Status Comment   MRSA, PCR NEGATIVE  NEGATIVE Final    Staphylococcus aureus NEGATIVE  NEGATIVE Final     Studies/Results: Dg Chest 2 View  03/05/2012  *RADIOLOGY REPORT*  Clinical Data: Preop for prostatectomy, former smoking history  CHEST - 2 VIEW  Comparison: None.  Findings: The lungs are clear.  Mediastinal contours appear normal. The heart is within normal limits in size.  No bony abnormality is seen.  IMPRESSION: No active lung disease.   Original  Report Authenticated By: Juline Patch, M.D.     Assessment/Plan: 1 Day Post-Op, Procedure(s) (LRB): ROBOTIC ASSISTED LAPAROSCOPIC RADICAL PROSTATECTOMY (N/A) LYMPHADENECTOMY (Bilateral) CYSTOSCOPY FLEXIBLE (N/A)  Doing well Ambulate, Incentive spirometry DVT prophylaxis Transition to PO pain medications D/C pelvic drain SL IVF Dulcolax supp   LOS: 1 day   YARBROUGH,Gracie Gupta G. 03/07/2012, 7:20 AM

## 2012-03-07 NOTE — Discharge Summary (Signed)
  Date of admission: 03/06/2012  Date of discharge: 03/07/2012  Admission diagnosis: Prostate Cancer  Discharge diagnosis: Prostate Cancer  History and Physical: For full details, please see admission history and physical. Briefly, Curtis [name] is a 66 y.o. gentleman with localized prostate cancer.  After discussing management/treatment options, he elected to proceed with surgical treatment.  Hospital Course: Curtis [name] was taken to the operating room on 03/06/2012 and underwent a robotic assisted laparoscopic radical prostatectomy. He tolerated this procedure well and without complications. Postoperatively, he was able to be transferred to a regular hospital room following recovery from anesthesia.  He was able to begin ambulating the night of surgery. He remained hemodynamically stable overnight.  He had excellent urine output with appropriately minimal output from his pelvic drain and his pelvic drain was removed on POD #1.  He was transitioned to oral pain medication, tolerated a clear liquid diet, and had met all discharge criteria and was able to be discharged home later on POD#1.  Laboratory values:  Basename 03/07/12 0432 03/06/12 1248 03/05/12 0930  HGB 11.7* 13.6 15.3  HCT 33.9* 39.8 44.0    Disposition: Home  Discharge instruction: He was instructed to be ambulatory but to refrain from heavy lifting, strenuous activity, or driving. He was instructed on urethral catheter care.  Discharge medications:     Medication List     As of 03/07/2012  9:56 AM    START taking these medications         ciprofloxacin 500 MG tablet   Commonly known as: CIPRO   Take 1 tablet (500 mg total) by mouth 2 (two) times daily. Start day prior to office visit for foley removal      HYDROcodone-acetaminophen 5-325 MG per tablet   Commonly known as: NORCO/VICODIN   Take 1-2 tablets by mouth every 6 (six) hours as needed for pain.      CONTINUE taking these medications        acetaminophen 325 MG tablet   Commonly known as: TYLENOL      albuterol 108 (90 BASE) MCG/ACT inhaler   Commonly known as: PROVENTIL HFA;VENTOLIN HFA      budesonide-formoterol 160-4.5 MCG/ACT inhaler   Commonly known as: SYMBICORT      guaiFENesin 600 MG 12 hr tablet   Commonly known as: MUCINEX      STOP taking these medications         aspirin EC 325 MG tablet      ibuprofen 200 MG tablet   Commonly known as: ADVIL,MOTRIN          Where to get your medications    These are the prescriptions that you need to pick up.   You may get these medications from any pharmacy.         ciprofloxacin 500 MG tablet   HYDROcodone-acetaminophen 5-325 MG per tablet            Followup: He will followup in 1 week for catheter removal and to discuss his surgical pathology results.

## 2012-03-08 ENCOUNTER — Encounter (HOSPITAL_COMMUNITY): Payer: Self-pay | Admitting: Urology

## 2012-12-30 ENCOUNTER — Encounter: Payer: Self-pay | Admitting: Pulmonary Disease

## 2012-12-30 ENCOUNTER — Ambulatory Visit (INDEPENDENT_AMBULATORY_CARE_PROVIDER_SITE_OTHER): Payer: Medicare Other | Admitting: Pulmonary Disease

## 2012-12-30 VITALS — BP 100/62 | HR 64 | Temp 98.5°F | Ht 68.75 in | Wt 205.0 lb

## 2012-12-30 DIAGNOSIS — J449 Chronic obstructive pulmonary disease, unspecified: Secondary | ICD-10-CM

## 2012-12-30 DIAGNOSIS — J4489 Other specified chronic obstructive pulmonary disease: Secondary | ICD-10-CM

## 2012-12-30 NOTE — Patient Instructions (Signed)
Keep taking the Symbicort as you are doing We will request records from your primary care doctor's office and [facility]  Exercise regularly  We will see you back in 6 months or sooner if needed

## 2012-12-30 NOTE — Assessment & Plan Note (Signed)
Curtis [name] has both asthma and COPD by history and states that he has learned to deal with them both over the years.  It sounds like he has been well controlled on symbicort.  Curtis [name] had a recent simple spirometry test and has had full PFT's in the last two or three years so we will request those records so we can known his severity.  In general, his functional status is excellent as he notes walking more than 9 holes while watching a golf tournament yesterday.   A chest x-ray from 02/2012 was normal.  Plan: -request records from PCP and [facility] for spirometry and PFT's -continue Symbicort -annual flu shot -f/u 6 months

## 2012-12-30 NOTE — Progress Notes (Signed)
Subjective:    Patient ID: Curtis [name], male    DOB: [birth year], 67 y.o.   MRN: [id number]  HPI  Curtis [name] is a very pleasant 67 year old male with a past medical history significant for asthma and COPD who comes to our clinic today for evaluation of the same. He smoked one half pack of cigarettes daily for 30 years and quit in the 1990s. He also smoked marijuana frequently in the 1960s. As a child he was diagnosed with asthma and apparently suffered significantly from this. At age 25 he was hospitalized and states that he nearly died but improved significantly with high-dose steroid injections. He was followed by Dr. Stevphen Rochester since the 1980s for both asthma and COPD. He had been treated with immunotherapy at one point in the past. He states that he "wheezes all the time" and he describes himself as "a mucous machine". Despite this, he manages his symptoms quite well and was actually capable of walking more than 9 holes while following the local golf tournament yesterday.  He uses albuterol less than twice a week and states that his shortness of breath has been well-controlled with Symbicort which he is used for the last 2 years. He has also used Advair in the past and states that he believes he has been on a combination inhaler for at least the last 7 years. He does not smoke anymore. He notes that he does have some fatigue but does not have morning headaches or daytime somnolence.   Past Medical History  Diagnosis Date  . Asthma   . COPD (chronic obstructive pulmonary disease)   . Shortness of breath     with exertion  . Prostate cancer      No family history on file.   History   Social History  . Marital Status: Married    Spouse Name: N/A    Number of Children: N/A  . Years of Education: N/A   Occupational History  . Not on file.   Social History Main Topics  . Smoking status: Former Smoker -- 0.25 packs/day for 10 years    Types: Cigarettes    Quit date:  05/15/1993  . Smokeless tobacco: Not on file  . Alcohol Use: Yes     Comment: 2 glasses wine at night  . Drug Use: No  . Sexual Activity: Not on file   Other Topics Concern  . Not on file   Social History Narrative  . No narrative on file     Allergies  Allergen Reactions  . Penicillins Other (See Comments)    Negative feeling      Outpatient Prescriptions Prior to Visit  Medication Sig Dispense Refill  . albuterol (PROVENTIL HFA;VENTOLIN HFA) 108 (90 BASE) MCG/ACT inhaler Inhale 2 puffs into the lungs every 6 (six) hours as needed. For shortness of breath      . budesonide-formoterol (SYMBICORT) 160-4.5 MCG/ACT inhaler Inhale 2 puffs into the lungs 2 (two) times daily.      Marland Kitchen acetaminophen (TYLENOL) 325 MG tablet Take 650 mg by mouth every 6 (six) hours as needed.      Marland Kitchen guaiFENesin (MUCINEX) 600 MG 12 hr tablet Take 1,200 mg by mouth 2 (two) times daily as needed.      . ciprofloxacin (CIPRO) 500 MG tablet Take 1 tablet (500 mg total) by mouth 2 (two) times daily. Start day prior to office visit for foley removal  6 tablet  0  . oxyCODONE (ROXICODONE) 5 MG immediate release  tablet Take 1 tablet (5 mg total) by mouth every 4 (four) hours as needed for pain (1-2 tabs as needed every 4-6 hours).  30 tablet  0   No facility-administered medications prior to visit.      Review of Systems  Constitutional: Negative for fever, chills, diaphoresis, activity change, appetite change, fatigue and unexpected weight change.  HENT: Negative for hearing loss, ear pain, nosebleeds, congestion, sore throat, facial swelling, rhinorrhea, sneezing, mouth sores, trouble swallowing, neck pain, neck stiffness, dental problem, voice change, postnasal drip, sinus pressure, tinnitus and ear discharge.   Eyes: Negative for photophobia, discharge, itching and visual disturbance.  Respiratory: Positive for cough and shortness of breath. Negative for apnea, choking, chest tightness, wheezing and stridor.    Cardiovascular: Negative for chest pain, palpitations and leg swelling.  Gastrointestinal: Negative for nausea, vomiting, abdominal pain, constipation, blood in stool and abdominal distention.  Genitourinary: Negative for dysuria, urgency, frequency, hematuria, flank pain, decreased urine volume and difficulty urinating.  Musculoskeletal: Negative for myalgias, back pain, joint swelling, arthralgias and gait problem.  Skin: Negative for color change, pallor and rash.  Neurological: Negative for dizziness, tremors, seizures, syncope, speech difficulty, weakness, light-headedness, numbness and headaches.  Hematological: Negative for adenopathy. Does not bruise/bleed easily.  Psychiatric/Behavioral: Negative for confusion, sleep disturbance and agitation. The patient is not nervous/anxious.        Objective:   Physical Exam  Filed Vitals:   12/30/12 1005  BP: 100/62  Pulse: 64  Temp: 98.5 F (36.9 C)  TempSrc: Oral  Height: 5' 8.75" (1.746 m)  Weight: 205 lb (92.987 kg)  SpO2: 98%   Gen: well appearing, no acute distress HEENT: NCAT, PERRL, EOMi, OP clear, neck supple without masses PULM: Insp and exp wheezing bilaterally, normal percussion, good air movement CV: RRR, no mgr, no JVD AB: BS+, soft, nontender, no hsm Ext: warm, no edema, no clubbing, no cyanosis Derm: no rash or skin breakdown Neuro: A&Ox4, CN II-XII intact, strength 5/5 in all 4 extremities       Assessment & Plan:   COPD with asthma Curtis [name] has both asthma and COPD by history and states that he has learned to deal with them both over the years.  It sounds like he has been well controlled on symbicort.  Curtis [name] had a recent simple spirometry test and has had full PFT's in the last two or three years so we will request those records so we can known his severity.  In general, his functional status is excellent as he notes walking more than 9 holes while watching a golf tournament yesterday.   A chest x-ray  from 02/2012 was normal.  Plan: -request records from PCP and [facility] for spirometry and PFT's -continue Symbicort -annual flu shot -f/u 6 months   Updated Medication List Outpatient Encounter Prescriptions as of 12/30/2012  Medication Sig Dispense Refill  . albuterol (PROVENTIL HFA;VENTOLIN HFA) 108 (90 BASE) MCG/ACT inhaler Inhale 2 puffs into the lungs every 6 (six) hours as needed. For shortness of breath      . budesonide-formoterol (SYMBICORT) 160-4.5 MCG/ACT inhaler Inhale 2 puffs into the lungs 2 (two) times daily.      Marland Kitchen acetaminophen (TYLENOL) 325 MG tablet Take 650 mg by mouth every 6 (six) hours as needed.      Marland Kitchen guaiFENesin (MUCINEX) 600 MG 12 hr tablet Take 1,200 mg by mouth 2 (two) times daily as needed.      . [DISCONTINUED] ciprofloxacin (CIPRO) 500 MG tablet Take 1  tablet (500 mg total) by mouth 2 (two) times daily. Start day prior to office visit for foley removal  6 tablet  0  . [DISCONTINUED] oxyCODONE (ROXICODONE) 5 MG immediate release tablet Take 1 tablet (5 mg total) by mouth every 4 (four) hours as needed for pain (1-2 tabs as needed every 4-6 hours).  30 tablet  0   No facility-administered encounter medications on file as of 12/30/2012.

## 2013-09-23 ENCOUNTER — Ambulatory Visit: Payer: Medicare Other | Admitting: Radiation Oncology

## 2013-09-30 ENCOUNTER — Encounter: Payer: Self-pay | Admitting: Radiation Oncology

## 2013-09-30 NOTE — Progress Notes (Signed)
GU Location of Tumor / Histology: prostatic adenocarcinoma  If Prostate Cancer, Gleason Score is (4 + 3) and PSA is (0.06)  Patient has had a slowly rising PSA last 18 months status post his surgery  Biopsies of prostate (if applicable) revealed:   Past/Anticipated interventions by urology, if any: prostatectomy in October 2013  Past/Anticipated interventions by medical oncology, if any: None  Weight changes, if any: None noted  Bowel/Bladder complaints, if any: urine stream is better than it was prior to prostatectomy and erections have improved   Nausea/Vomiting, if any: None noted  Pain issues, if any:  None noted  SAFETY ISSUES:  Prior radiation? NO  Pacemaker/ICD? NO  Possible current pregnancy? N/A  Is the patient on methotrexate? NO  Current Complaints / other details:  68 year old male. [id number] Referral for consideration of salvage radiation

## 2013-10-01 ENCOUNTER — Ambulatory Visit
Admission: RE | Admit: 2013-10-01 | Discharge: 2013-10-01 | Disposition: A | Discharge status: Home / Self Care | Payer: Medicare Other | Source: Ambulatory Visit | Attending: Radiation Oncology | Admitting: Radiation Oncology

## 2013-10-01 ENCOUNTER — Encounter: Payer: Self-pay | Admitting: Radiation Oncology

## 2013-10-01 VITALS — BP 123/78 | HR 68 | Resp 16 | Ht 68.0 in | Wt 207.6 lb

## 2013-10-01 DIAGNOSIS — Z7982 Long term (current) use of aspirin: Secondary | ICD-10-CM | POA: Insufficient documentation

## 2013-10-01 DIAGNOSIS — Z8042 Family history of malignant neoplasm of prostate: Secondary | ICD-10-CM | POA: Insufficient documentation

## 2013-10-01 DIAGNOSIS — Z87891 Personal history of nicotine dependence: Secondary | ICD-10-CM | POA: Insufficient documentation

## 2013-10-01 DIAGNOSIS — C61 Malignant neoplasm of prostate: Secondary | ICD-10-CM | POA: Diagnosis not present

## 2013-10-01 DIAGNOSIS — Z9109 Other allergy status, other than to drugs and biological substances: Secondary | ICD-10-CM | POA: Insufficient documentation

## 2013-10-01 DIAGNOSIS — Z510 Encounter for antineoplastic radiation therapy: Secondary | ICD-10-CM | POA: Diagnosis not present

## 2013-10-01 DIAGNOSIS — J45909 Unspecified asthma, uncomplicated: Secondary | ICD-10-CM | POA: Insufficient documentation

## 2013-10-01 NOTE — Progress Notes (Signed)
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 Also, she stays active playing golf. Reports he has COPD and relates his fatigue to this. Reports he has an ache right shoulder, palpable cervical lumps on his spine, and lower left pelvis pain which he relates to the effects of arthritis. Reports he has had several scalp lesions "burned off" but, has never been told any are cancerous. Denies diarrhea, pain associated with bowel movements or blood in stool. Reports occasional small amounts of urinary leakage when sitting. Reports nocturia x1. Reports within the last 4-5 months he has felt tingling with urination. Denies hematuria. Weight stable. Denies nausea, vomiting or dizziness.

## 2013-10-01 NOTE — Progress Notes (Signed)
See progress note under physician encounter. 

## 2013-10-01 NOTE — Progress Notes (Signed)
[facility] NEW PATIENT EVALUATION  Name: Curtis [name] MRN: [id number]  Date:   10/01/2013           DOB: Oct 04, 1945  Status: outpatient   CC: Curtis Pac, MD  Curtis Amass, MD    REFERRING PHYSICIAN: Bernestine Amass, MD   DIAGNOSIS: Pathologic stage pT3b, PSA recurrent carcinoma the prostate   HISTORY OF PRESENT ILLNESS:  Curtis [name] is a 68 y.o. male who is seen today through the courtesy of Dr. Risa Grill for consideration of salvage radiation therapy in the management of his PSA recurrent carcinoma the prostate. He was found have a PSA of 4.18 in July 2011. His PSA rose to 7.27 with a free PSA percentage of 5% by July 2013. He underwent ultrasound-guided biopsies on 12/25/2011 and was found to have extensive Gleason 7, both 3+4, and 4+3 carcinoma. In addition, one biopsy from the right base was Gleason 8 (4+4) involving 70% of one core. He underwent I nerve sparing robotic prostatectomy by Dr. Risa Grill on 03/06/2012. His was found to have Gleason 7 (4+3) carcinoma with bilateral extracapsular extension and also left seminal vesicle involvement. 12 percent of the prostate contained carcinoma. There was no involvement of the apex. The area of tumor within the left seminal vesicle was 0.1 cm from an inked margin. His first postoperative PSA was 0.04 in December 2013, rising to 0.08 by November 2014, and most recently 0.10 on 09/10/2013. Dr. Risa Grill encouraged him to have salvage radiation therapy. He has good urinary control but does have stress urinary incontinence for which he wears a pad. He does have partial erectile activity with low-dose Cialis. Of note is that he does have a history of urethral stricture. No GI difficulties.  PREVIOUS RADIATION THERAPY: No   PAST MEDICAL HISTORY:  has a past medical history of Asthma; COPD (chronic obstructive pulmonary disease); Shortness of breath; and Prostate cancer.     PAST SURGICAL HISTORY:  Past  Surgical History  Procedure Laterality Date  . Surgery for urethral stricture in the early 1990's at [facility]    . 2008 right achilles tendon surgery    . Robot assisted laparoscopic radical prostatectomy  03/06/2012    Procedure: ROBOTIC ASSISTED LAPAROSCOPIC RADICAL PROSTATECTOMY;  Surgeon: Curtis Amass, MD;  Location: [facility];  Service: Urology;  Laterality: N/A;  WITH BILATERAL PELVIC LYMPH NODE DISSECTION  . Cystoscopy  03/06/2012    Procedure: CYSTOSCOPY FLEXIBLE;  Surgeon: Curtis Amass, MD;  Location: [facility];  Service: Urology;  Laterality: N/A;  with urethral dialation     FAMILY HISTORY: family history includes Cancer in his father.  His father was diagnosed with prostate cancer at age 46, and died at age 66 from COPD/pneumonia. His mother died from infection following kidney stones.   SOCIAL HISTORY:  reports that he quit smoking about 20 years ago. His smoking use included Cigarettes. He has a 2.5 pack-year smoking history. He does not have any smokeless tobacco history on file. He reports that he drinks alcohol. He reports that he does not use illicit drugs. Married, 36 year old son.   ALLERGIES: Penicillins   MEDICATIONS:  Current Outpatient Prescriptions  Medication Sig Dispense Refill  . acetaminophen (TYLENOL) 325 MG tablet Take 650 mg by mouth every 6 (six) hours as needed.      Marland Kitchen albuterol (PROVENTIL HFA;VENTOLIN HFA) 108 (90 BASE) MCG/ACT inhaler Inhale 2 puffs into the lungs every 6 (six) hours as needed. For shortness of breath      .  aspirin 325 MG tablet Take 325 mg by mouth daily.      . budesonide-formoterol (SYMBICORT) 160-4.5 MCG/ACT inhaler Inhale 2 puffs into the lungs 2 (two) times daily.      . tadalafil (CIALIS) 10 MG tablet Take 10 mg by mouth daily as needed for erectile dysfunction.      Marland Kitchen guaiFENesin (MUCINEX) 600 MG 12 hr tablet Take 1,200 mg by mouth 2 (two) times daily as needed.       No current facility-administered medications for this encounter.      REVIEW OF SYSTEMS:  Pertinent items are noted in HPI.    PHYSICAL EXAM:  height is 5\' 8"  (1.727 m) and weight is 207 lb 9.6 oz (94.167 kg). His blood pressure is 123/78 and his pulse is 68. His respiration is 16.   Alert and oriented 68 year old white male appearing his stated age. Head and neck examination: Grossly unremarkable. Nodes: Without palpable cervical or supraclavicular lymphadenopathy. Chest: Bilateral wheezes and rhonchi throughout both mid to lower lung zones. Abdomen: Without hepatomegaly. Genitalia: Unremarkable to inspection. Rectal: The prostate bed is flat and is without masses or nodularity. Extremities: Without edema.   LABORATORY DATA:  Lab Results  Component Value Date   WBC 6.8 03/05/2012   HGB 11.7* 03/07/2012   HCT 33.9* 03/07/2012   MCV 87.8 03/05/2012   PLT 258 03/05/2012   Lab Results  Component Value Date   NA 134* 03/07/2012   K 3.7 03/07/2012   CL 103 03/07/2012   CO2 23 03/07/2012   No results found for this basename: ALT, AST, GGT, ALKPHOS, BILITOT   PSA 0.10 from 09/10/2013   IMPRESSION: Pathologic stage  pT3b PSA recurrent carcinoma the prostate. I explained to the patient that he has either a local failure alone, distant failure alone, or both local and distant failure. Clinical predictors for local failure alone, and perhaps salvage with radiation therapy include a positive surgical margin, pathologic stage T3a/b disease, a disease-free interval, slow rise in his PSA and initial PSA of less than 10 with a Gleason score of 7 or less. His surgical margin along the left seminal vesicle was only 0.1 cm. He has a reasonable risk for local therapy alone, and thus I would certainly offer him potential salvage radiation therapy. We discussed the potential acute and late toxicities of radiation therapy. We also discussed having a comfortably full bladder for his CT simulation, and daily treatment. He is interested in proceeding with salvage radiation  therapy would like to wait at least until early to mid summer which I think is perfectly reasonable. Consent is signed today. He was given literature for review.   PLAN: As discussed above. The patient will call me when he is ready for his CT simulation.   I spent 60 minutes minutes face to face with the patient and more than 50% of that time was spent in counseling and/or coordination of care.

## 2013-12-29 ENCOUNTER — Telehealth: Payer: Self-pay | Admitting: Pulmonary Disease

## 2013-12-29 MED ORDER — ALBUTEROL SULFATE HFA 108 (90 BASE) MCG/ACT IN AERS: 2.0000 | INHALATION_SPRAY | Freq: Four times a day (QID) | RESPIRATORY_TRACT | Status: DC | PRN

## 2013-12-29 MED ORDER — BUDESONIDE-FORMOTEROL FUMARATE 160-4.5 MCG/ACT IN AERO: 2.0000 | INHALATION_SPRAY | Freq: Two times a day (BID) | RESPIRATORY_TRACT | Status: DC

## 2013-12-29 NOTE — Telephone Encounter (Signed)
Spoke with pt and advised that refills were sent to [facility] on [address].  Reminded pt of appt with Dr Fayne Mediate on 12/31/13.

## 2013-12-31 ENCOUNTER — Encounter: Payer: Self-pay | Admitting: Pulmonary Disease

## 2013-12-31 ENCOUNTER — Encounter (INDEPENDENT_AMBULATORY_CARE_PROVIDER_SITE_OTHER): Payer: Self-pay

## 2013-12-31 ENCOUNTER — Ambulatory Visit (INDEPENDENT_AMBULATORY_CARE_PROVIDER_SITE_OTHER): Payer: Medicare Other | Admitting: Pulmonary Disease

## 2013-12-31 VITALS — BP 110/70 | HR 70 | Ht 68.0 in | Wt 206.0 lb

## 2013-12-31 DIAGNOSIS — J3089 Other allergic rhinitis: Secondary | ICD-10-CM

## 2013-12-31 DIAGNOSIS — J309 Allergic rhinitis, unspecified: Secondary | ICD-10-CM | POA: Insufficient documentation

## 2013-12-31 DIAGNOSIS — J449 Chronic obstructive pulmonary disease, unspecified: Secondary | ICD-10-CM

## 2013-12-31 DIAGNOSIS — R5383 Other fatigue: Secondary | ICD-10-CM

## 2013-12-31 DIAGNOSIS — R5381 Other malaise: Secondary | ICD-10-CM

## 2013-12-31 NOTE — Assessment & Plan Note (Signed)
Today his airflow obstruction is noted to be moderate. He has features of both COPD and asthma. Because of all of his mucus production it does make me question the possibility of an atypical infection, but his weight has been stable and he has not been experiencing fevers or chills said that is less likely.  I think a lot of his mucus production is related to allergies and allergic rhinitis. He is currently not taking anything for that.  Plan: -Increase Symbicort to 2 puffs twice a day -If no improvement in cough, shortness of breath over the next 4-5 days after increasing the dose of the Symbicort then take prednisone taper and Z-Pak (given written prescription today).

## 2013-12-31 NOTE — Progress Notes (Signed)
Subjective:    Patient ID: Curtis [name], male    DOB: [birth year], 68 y.o.   MRN: [id number]  Synopsis: Chronic obstructive asthma and COPD first seen in 2014 . Previously followed by Dr. Velora Heckler here in town, had severe asthma as a child.  HPI  12/31/2013 ROV > I has been a year since I have seen Curtis [name]. During that time he has developed worsening fatigue as well as some shortness of breath on exertion. In the last several weeks he has noticed no change in his chronic baseline mucus production (copious amounts of brown mucus) that he has noticed increasing sinus congestion and drainage. He has not taken anything for sinus disease other than occasional saline sprays. He continues to use his Symbicort. He recently changed from the 80 mcg dose at 2 puffs twice a day to the 160 dose at 1 puff twice a day. He says he has not noticed a significant change during that time. He denies chest pain, fevers, chills, or weight loss. Apparently he has been found to have an elevated PSA and it has been recommended that he undergo radiation therapy. He has been debating went to start this. Apparently he has been very stressed out lately with work and family matters. He says that his sinus congestion always gets worse this time year when ragweed comes out.  Past Medical History  Diagnosis Date  . Asthma   . COPD (chronic obstructive pulmonary disease)   . Shortness of breath     with exertion  . Prostate cancer      Review of Systems  Constitutional: Positive for fatigue. Negative for fever and chills.  HENT: Negative for postnasal drip, rhinorrhea and sinus pressure.   Respiratory: Positive for cough and shortness of breath. Negative for wheezing.   Cardiovascular: Negative for chest pain, palpitations and leg swelling.  Psychiatric/Behavioral: Negative for suicidal ideas (.dbmexam).       Objective:   Physical Exam Filed Vitals:   12/31/13 1551  BP: 110/70  Pulse: 70  Height: 5\' 8"  (1.727  m)  Weight: 206 lb (93.441 kg)  SpO2: 94%   RA  Gen: well appearing, no acute distress HEENT: NCAT, EOMi, OP clear, neck supple without masses PULM: wheezing upper lobes, rhonchi, crackles RLL CV: RRR, no mgr, no JVD AB: BS+, soft, nontender, no hsm Ext: warm, no edema, no clubbing, no cyanosis Derm: no rash or skin breakdown Neuro: A&Ox4, MAEW         Assessment & Plan:   COPD with asthma Today his airflow obstruction is noted to be moderate. He has features of both COPD and asthma. Because of all of his mucus production it does make me question the possibility of an atypical infection, but his weight has been stable and he has not been experiencing fevers or chills said that is less likely.  I think a lot of his mucus production is related to allergies and allergic rhinitis. He is currently not taking anything for that.  Plan: -Increase Symbicort to 2 puffs twice a day -If no improvement in cough, shortness of breath over the next 4-5 days after increasing the dose of the Symbicort then take prednisone taper and Z-Pak (given written prescription today).  Allergic rhinitis I told him that he should take generic Zyrtec, Nasacort, and Milta Deiters med rinses daily  Other malaise and fatigue I explained to him today that the differential diagnosis of fatigue is broad and I offered to help start a workup with blood  work and chest imaging. He asked that we hold off on the blood work and allow his primary care physician to do that. So order a chest x-ray to make sure that there is not evidence of an occult infection or malignancy contributing.    Updated Medication List Outpatient Encounter Prescriptions as of 12/31/2013  Medication Sig  . acetaminophen (TYLENOL) 325 MG tablet Take 650 mg by mouth every 6 (six) hours as needed.  Marland Kitchen albuterol (PROVENTIL HFA;VENTOLIN HFA) 108 (90 BASE) MCG/ACT inhaler Inhale 2 puffs into the lungs every 6 (six) hours as needed. For shortness of breath  .  aspirin 325 MG tablet Take 325 mg by mouth daily.  . budesonide-formoterol (SYMBICORT) 160-4.5 MCG/ACT inhaler Inhale 2 puffs into the lungs 2 (two) times daily.  Marland Kitchen guaiFENesin (MUCINEX) 600 MG 12 hr tablet Take 1,200 mg by mouth 2 (two) times daily as needed.  Marland Kitchen POTASSIUM PO Take by mouth. Pt takes as needed, when he "sweats a lot". Takes to prevent cramping.  . tadalafil (CIALIS) 10 MG tablet Take 10 mg by mouth daily as needed for erectile dysfunction.

## 2013-12-31 NOTE — Assessment & Plan Note (Signed)
I told him that he should take generic Zyrtec, Nasacort, and Milta Deiters med rinses daily

## 2013-12-31 NOTE — Patient Instructions (Signed)
Use Neil Med rinses with distilled water at least twice per day using the instructions on the package. 1/2 hour after using the [facility] Med rinse, use Nasacort two puffs in each nostril once per day.  Remember that the Nasacort can take 1-2 weeks to work after regular use. Use generic zyrtec (cetirizine) every day.  If this doesn't help, then stop taking it and use chlorpheniramine-phenylephrine combination tablets.  Take the Symbicort 2 puffs twice a day no matter how you feel  We will see you back in 2-3 months

## 2013-12-31 NOTE — Assessment & Plan Note (Signed)
I explained to him today that the differential diagnosis of fatigue is broad and I offered to help start a workup with blood work and chest imaging. He asked that we hold off on the blood work and allow his primary care physician to do that. So order a chest x-ray to make sure that there is not evidence of an occult infection or malignancy contributing.

## 2014-07-14 ENCOUNTER — Telehealth: Payer: Self-pay | Admitting: *Deleted

## 2014-07-14 ENCOUNTER — Other Ambulatory Visit: Payer: Self-pay | Admitting: Family Medicine

## 2014-07-14 DIAGNOSIS — Z8546 Personal history of malignant neoplasm of prostate: Secondary | ICD-10-CM

## 2014-07-14 DIAGNOSIS — R1012 Left upper quadrant pain: Secondary | ICD-10-CM

## 2014-07-14 NOTE — Telephone Encounter (Signed)
CALLED PATIENT TO INFORM OF [facility]  APPT. FOR 07-29-14 - 3:20 PM FOR 3:50 PM , SPOKE WITH PATIENT AND HE IS AWARE OF THIS APPT.

## 2014-07-20 ENCOUNTER — Ambulatory Visit
Admission: RE | Admit: 2014-07-20 | Discharge: 2014-07-20 | Disposition: A | Discharge status: Home / Self Care | Payer: Medicare Other | Source: Ambulatory Visit | Attending: Family Medicine | Admitting: Family Medicine

## 2014-07-20 DIAGNOSIS — R1012 Left upper quadrant pain: Secondary | ICD-10-CM

## 2014-07-20 DIAGNOSIS — Z8546 Personal history of malignant neoplasm of prostate: Secondary | ICD-10-CM

## 2014-07-20 MED ORDER — IOPAMIDOL (ISOVUE-300) INJECTION 61%
125.0000 mL | Freq: Once | INTRAVENOUS | Status: AC | PRN
Start: 2014-07-20 — End: 2014-07-20
  Administered 2014-07-20: 125 mL via INTRAVENOUS

## 2014-07-28 NOTE — Progress Notes (Addendum)
GU Location of Tumor / Histology: Adenocarcinoma of the Prostate     If Prostate Cancer, Gleason Score is Gleason 7, both 3+4, and 4+3 carcinoma. In addition, one biopsy from the right base was Gleason 8 (4+4) involving 70% of one core.  [name]. Curtis [name] was found have a PSA of 4.18 in July 2011. His PSA rose to 7.27 with a free PSA percentage of 5% by July 2013. He underwent ultrasound-guided biopsies on 12/25/2011 and was found to have extensive Gleason 7, both 3+4, and 4+3 carcinoma. In addition, one biopsy from the right base was Gleason 8 (4+4) involving 70% of one core. He underwent I nerve sparing robotic prostatectomy by Dr. Risa Grill on 03/06/2012. His was found to have Gleason 7 (4+3) carcinoma with bilateral extracapsular extension and also left seminal vesicle involvement. 12 percent of the prostate contained carcinoma. There was no involvement of the apex. The area of tumor within the left seminal vesicle was 0.1 cm from an inked margin. His first postoperative PSA was 0.04 in December 2013, rising to 0.08 by November 2014, and most recently 0.10 on 09/10/2013. Dr. Risa Grill encouraged him to have salvage radiation therapy.   Past/Anticipated interventions by urology, if any: Dr. Rana Snare: Biopsy of the Prostate  Past/Anticipated interventions by medical oncology, if any:   Weight changes, if any:   Bowel/Bladder complaints, if any: He has good urinary control but does have stress urinary incontinence for which he wears a pad  Nausea/Vomiting, if any:  Pain issues, if any:    SAFETY ISSUES:  Prior radiation? No  Pacemaker/ICD? zno  Possible current pregnancy? N/A  Is the patient on methotrexate? No  Current Complaints / other details:    He was found have a PSA of 4.18 in July 2011. His PSA rose to 7.27 with a free PSA percentage of 5% by July 2013. He underwent ultrasound-guided biopsies on 12/25/2011 and was found to have extensive Gleason 7, both 3+4, and 4+3  carcinoma. In addition, one biopsy from the right base was Gleason 8 (4+4) involving 70% of one core. He underwent I nerve sparing robotic prostatectomy by Dr. Risa Grill on 03/06/2012. His was found to have Gleason 7 (4+3) carcinoma with bilateral extracapsular extension and also left seminal vesicle involvement. 12 percent of the prostate contained carcinoma. There was no involvement of the apex. The area of tumor within the left seminal vesicle was 0.1 cm from an inked margin. His first postoperative PSA was 0.04 in December 2013, rising to 0.08 by November 2014, and most recently 0.10 on 09/10/2013. Dr. Risa Grill encouraged him to have salvage radiation therapy. He has good urinary control but does have stress urinary incontinence for which he wears a pad. He does have partial erectile activity with low-dose Cialis. Of note is that he does have a history of urethral stricture. No GI difficulties.    Today her reports occasional burning with urination.  He reports progressive fatigue.  He also mentioned that he has COPD and has been coughing with yellow sputum.  He said he had a recent PSA done by Dr. Rex Kras which was 0.18.  He had a CT scan on 3/7 for left upper quadrant pain.  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  BP 133/91 mmHg  Pulse 69  Temp(Src) 98.1 F (36.7 C) (Oral)  Resp 20  Ht 5\' 8"  (1.727 m)  Wt 204 lb 3.2 oz (92.625 kg)  BMI 31.06 kg/m2

## 2014-07-29 ENCOUNTER — Encounter: Payer: Self-pay | Admitting: Radiation Oncology

## 2014-07-29 ENCOUNTER — Ambulatory Visit
Admission: RE | Admit: 2014-07-29 | Discharge: 2014-07-29 | Disposition: A | Discharge status: Home / Self Care | Payer: Medicare Other | Source: Ambulatory Visit | Attending: Radiation Oncology | Admitting: Radiation Oncology

## 2014-07-29 ENCOUNTER — Telehealth: Payer: Self-pay | Admitting: Oncology

## 2014-07-29 VITALS — BP 133/91 | HR 69 | Temp 98.1°F | Resp 20 | Ht 68.0 in | Wt 204.2 lb

## 2014-07-29 DIAGNOSIS — Z9079 Acquired absence of other genital organ(s): Secondary | ICD-10-CM | POA: Diagnosis not present

## 2014-07-29 DIAGNOSIS — C61 Malignant neoplasm of prostate: Secondary | ICD-10-CM

## 2014-07-29 DIAGNOSIS — R300 Dysuria: Secondary | ICD-10-CM | POA: Insufficient documentation

## 2014-07-29 DIAGNOSIS — C637 Malignant neoplasm of other specified male genital organs: Secondary | ICD-10-CM | POA: Diagnosis not present

## 2014-07-29 DIAGNOSIS — J449 Chronic obstructive pulmonary disease, unspecified: Secondary | ICD-10-CM | POA: Insufficient documentation

## 2014-07-29 DIAGNOSIS — Z510 Encounter for antineoplastic radiation therapy: Secondary | ICD-10-CM | POA: Diagnosis present

## 2014-07-29 NOTE — Telephone Encounter (Signed)
Called Medical Records at Dr. Eddie Dibbles office/[facility] requesting Curtis [name] last PSA.  They said the would fax it to [phone].

## 2014-07-29 NOTE — Progress Notes (Signed)
CC: Dr. Rana Snare, Dr. Hulan Fess  Follow-up note:  Diagnosis:  Pathologic stage pT3b, PSA recurrent carcinoma the prostate  History: [name] is seen today for a follow-up visit wanting to proceed with radiotherapy to his prostate bed in the management of his pathologic stage pT3b PSA recurrent carcinoma the prostate.  I first saw the patient in consultation on 10/01/2013.   He was found have a PSA of 4.18 in July 2011. His PSA rose to 7.27 with a free PSA percentage of 5% by July 2013. He underwent ultrasound-guided biopsies on 12/25/2011 and was found to have extensive Gleason 7, both 3+4, and 4+3 carcinoma. In addition, one biopsy from the right base was Gleason 8 (4+4) involving 70% of one core. He underwent I nerve sparing robotic prostatectomy by Dr. Risa Grill on 03/06/2012. His was found to have Gleason 7 (4+3) carcinoma with bilateral extracapsular extension and also left seminal vesicle involvement. 12 percent of the prostate contained carcinoma. There was no involvement of the apex. The area of tumor within the left seminal vesicle was 0.1 cm from an inked margin. His first postoperative PSA was 0.04 in December 2013, rising to 0.08 by November 2014, and most recently 0.10 on 09/10/2013. Dr. Risa Grill encouraged him to have salvage radiation therapy.  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  He tells me his last PSA with Dr. Hulan Fess was 0.18 a couple weeks ago.  He has not seen Dr. Risa Grill in the recent past.  He is still doing well from a GU and GI standpoint.  He does have occasional minimal urinary leakage, but does not require a pad.  He states that he can occasionally have erections with Cialis, but is not sexually active, primarily because of his COPD and dyspnea.  No GI difficulties.   Physical examination: Alert and oriented. Filed Vitals:   07/29/14 1516  BP: 133/91  Pulse: 69   Temp: 98.1 F (36.7 C)  Resp: 20   Rectal examination: Prostate bed is flat and is without masses or nodularity.  Laboratory data: PSA 0.18, according to the patient from approximately 2 weeks ago at Dr. Eddie Dibbles office.  (Results have been requested to be faxed)  Impression:  Pathologic stage pT3b, PSA recurrent carcinoma the prostate.  We again discussed the indications and timing of postoperative/salvage radiation therapy.  In his favor is pathologic stage T3b disease with a close surgical margin.  In addition, he had extracapsular extension.  Success with salvage radiation therapy is felt to be improved if one starts below a PSA rise of no greater than 0.2.  He has reached that threshold.  We discussed the potential acute and late toxicities of radiation therapy.  We also discussed bladder filling to minimize urinary toxicity.  He wants to move ahead. Consent is signed today.  I will have him return for CT simulation early next week.  30 minutes was spent face-to-face with the patient, primarily counseling the patient and coordinating his care.

## 2014-07-29 NOTE — Progress Notes (Signed)
Please see the Nurse Progress Note in the MD Initial Consult Encounter for this patient. 

## 2014-08-03 ENCOUNTER — Ambulatory Visit
Admission: RE | Admit: 2014-08-03 | Discharge: 2014-08-03 | Disposition: A | Discharge status: Home / Self Care | Payer: Medicare Other | Source: Ambulatory Visit | Attending: Radiation Oncology | Admitting: Radiation Oncology

## 2014-08-03 DIAGNOSIS — C61 Malignant neoplasm of prostate: Secondary | ICD-10-CM

## 2014-08-03 DIAGNOSIS — Z510 Encounter for antineoplastic radiation therapy: Secondary | ICD-10-CM | POA: Diagnosis not present

## 2014-08-03 NOTE — Progress Notes (Signed)
Complex simulation/treatment planning note: The patient was taken to the simulator.  A Vac lock immobilization device was constructed.  A red rubber tube was placed in the rectal vault.  He was then catheterized and contrast instilled into the bladder/urethra.  The CT data set was sent to the  MIM system where I contoured his high risk prostate bed (CTV 66) and expanded this by 0.5 cm to create PTV 66.  This volume will receive 6600 cGy in 33 sessions.  I also contoured his bladder, rectum, and rectosigmoid colon.  He is now ready for IMRT simulation/treatment planning.

## 2014-08-05 ENCOUNTER — Encounter: Payer: Self-pay | Admitting: Radiation Oncology

## 2014-08-05 DIAGNOSIS — Z510 Encounter for antineoplastic radiation therapy: Secondary | ICD-10-CM | POA: Diagnosis not present

## 2014-08-05 NOTE — Progress Notes (Signed)
IMRT simulation/treatment planning note: The patient completed his IMRT treatment planning in the management of his recurrent carcinoma the prostate.  IMRT was chosen to decrease the risk for both acute and late bladder and rectal toxicity compared to 3-D conformal or conventional radiation therapy.  Dose volume histograms were obtained for the target structures including the high-risk prostate tumor bed and also avoidance structures including the bladder, rectum, and femoral heads.  We met our departmental guidelines.  I'm prescribing 6600 cGy in 33 sessions utilizing dual ARC VMAT IMRT with 6 MV photons.

## 2014-08-06 DIAGNOSIS — Z510 Encounter for antineoplastic radiation therapy: Secondary | ICD-10-CM | POA: Diagnosis not present

## 2014-08-10 ENCOUNTER — Ambulatory Visit: Payer: Medicare Other

## 2014-08-11 ENCOUNTER — Ambulatory Visit: Payer: Medicare Other

## 2014-08-12 ENCOUNTER — Ambulatory Visit
Admission: RE | Admit: 2014-08-12 | Discharge: 2014-08-12 | Disposition: A | Discharge status: Home / Self Care | Payer: Medicare Other | Source: Ambulatory Visit | Attending: Radiation Oncology | Admitting: Radiation Oncology

## 2014-08-12 DIAGNOSIS — Z510 Encounter for antineoplastic radiation therapy: Secondary | ICD-10-CM | POA: Diagnosis not present

## 2014-08-13 ENCOUNTER — Ambulatory Visit
Admission: RE | Admit: 2014-08-13 | Discharge: 2014-08-13 | Disposition: A | Discharge status: Home / Self Care | Payer: Medicare Other | Source: Ambulatory Visit | Attending: Radiation Oncology | Admitting: Radiation Oncology

## 2014-08-13 DIAGNOSIS — Z510 Encounter for antineoplastic radiation therapy: Secondary | ICD-10-CM | POA: Diagnosis not present

## 2014-08-14 ENCOUNTER — Ambulatory Visit
Admission: RE | Admit: 2014-08-14 | Discharge: 2014-08-14 | Disposition: A | Discharge status: Home / Self Care | Payer: Medicare Other | Source: Ambulatory Visit | Attending: Radiation Oncology | Admitting: Radiation Oncology

## 2014-08-14 DIAGNOSIS — Z510 Encounter for antineoplastic radiation therapy: Secondary | ICD-10-CM | POA: Diagnosis not present

## 2014-08-17 ENCOUNTER — Ambulatory Visit
Admission: RE | Admit: 2014-08-17 | Discharge: 2014-08-17 | Disposition: A | Discharge status: Home / Self Care | Payer: Medicare Other | Source: Ambulatory Visit | Attending: Radiation Oncology | Admitting: Radiation Oncology

## 2014-08-17 ENCOUNTER — Encounter: Payer: Self-pay | Admitting: Radiation Oncology

## 2014-08-17 VITALS — BP 113/74 | HR 61 | Temp 98.2°F | Ht 68.0 in | Wt 207.6 lb

## 2014-08-17 DIAGNOSIS — C61 Malignant neoplasm of prostate: Secondary | ICD-10-CM

## 2014-08-17 DIAGNOSIS — Z510 Encounter for antineoplastic radiation therapy: Secondary | ICD-10-CM | POA: Diagnosis not present

## 2014-08-17 NOTE — Progress Notes (Addendum)
Curtis [name] has received 4 fractions to pelvis.  No concerns at this time  Curtis [name] given the Radiation Therapy and You booklet and he read the appropriate pages indicated regarding radiation to the pelvis inclusive of management of skin in pelvic region, dysuria, diarrhea/rectal irritation and fatigue.  Stated he did not need to keep the booklet.

## 2014-08-17 NOTE — Progress Notes (Signed)
Weekly Management Note:  Site: Prostate bed Current Dose:  1000  cGy Projected Dose: 6600  cGy  Narrative: The patient is seen today for routine under treatment assessment. CBCT/MVCT images/port films were reviewed. The chart was reviewed.   Bladder filling is satisfactory.  No new GU or GI difficulties.  Physical Examination:  Filed Vitals:   08/17/14 1138  BP: 113/74  Pulse: 61  Temp: 98.2 F (36.8 C)  .  Weight: 207 lb 9.6 oz (94.167 kg).  No change.  Impression: Tolerating radiation therapy well.  Plan: Continue radiation therapy as planned.

## 2014-08-18 ENCOUNTER — Ambulatory Visit
Admission: RE | Admit: 2014-08-18 | Discharge: 2014-08-18 | Disposition: A | Discharge status: Home / Self Care | Payer: Medicare Other | Source: Ambulatory Visit | Attending: Radiation Oncology | Admitting: Radiation Oncology

## 2014-08-18 DIAGNOSIS — Z510 Encounter for antineoplastic radiation therapy: Secondary | ICD-10-CM | POA: Diagnosis not present

## 2014-08-19 ENCOUNTER — Ambulatory Visit
Admission: RE | Admit: 2014-08-19 | Discharge: 2014-08-19 | Disposition: A | Discharge status: Home / Self Care | Payer: Medicare Other | Source: Ambulatory Visit | Attending: Radiation Oncology | Admitting: Radiation Oncology

## 2014-08-19 DIAGNOSIS — Z510 Encounter for antineoplastic radiation therapy: Secondary | ICD-10-CM | POA: Diagnosis not present

## 2014-08-20 ENCOUNTER — Ambulatory Visit
Admission: RE | Admit: 2014-08-20 | Discharge: 2014-08-20 | Disposition: A | Discharge status: Home / Self Care | Payer: Medicare Other | Source: Ambulatory Visit | Attending: Radiation Oncology | Admitting: Radiation Oncology

## 2014-08-20 DIAGNOSIS — Z510 Encounter for antineoplastic radiation therapy: Secondary | ICD-10-CM | POA: Diagnosis not present

## 2014-08-21 ENCOUNTER — Ambulatory Visit
Admission: RE | Admit: 2014-08-21 | Discharge: 2014-08-21 | Disposition: A | Discharge status: Home / Self Care | Payer: Medicare Other | Source: Ambulatory Visit | Attending: Radiation Oncology | Admitting: Radiation Oncology

## 2014-08-21 DIAGNOSIS — Z510 Encounter for antineoplastic radiation therapy: Secondary | ICD-10-CM | POA: Diagnosis not present

## 2014-08-24 ENCOUNTER — Ambulatory Visit
Admission: RE | Admit: 2014-08-24 | Discharge: 2014-08-24 | Disposition: A | Discharge status: Home / Self Care | Payer: Medicare Other | Source: Ambulatory Visit | Attending: Radiation Oncology | Admitting: Radiation Oncology

## 2014-08-24 ENCOUNTER — Ambulatory Visit: Payer: Medicare Other

## 2014-08-25 ENCOUNTER — Ambulatory Visit
Admission: RE | Admit: 2014-08-25 | Discharge: 2014-08-25 | Disposition: A | Discharge status: Home / Self Care | Payer: Medicare Other | Source: Ambulatory Visit | Attending: Radiation Oncology | Admitting: Radiation Oncology

## 2014-08-25 ENCOUNTER — Encounter: Payer: Self-pay | Admitting: Radiation Oncology

## 2014-08-25 DIAGNOSIS — Z510 Encounter for antineoplastic radiation therapy: Secondary | ICD-10-CM | POA: Diagnosis not present

## 2014-08-25 NOTE — Progress Notes (Addendum)
Weekly Management Note:  Site: Prostate bed Current Dose:  1800  cGy Projected Dose: 6600  cGy  Narrative: The patient is seen today for routine under treatment assessment. CBCT/MVCT images/port films were reviewed. The chart was reviewed.  He is treated this evening.  Bladder filling has been satisfactory.   He does report slight fatigue which her attributes to his underlying COPD.  No significant change in his GU or GI habits.  He does report a a few areas of excoriation along his upper extremities.  He was working in the yard over the weekend.  Physical Examination: There were no vitals filed for this visit..  Weight:  .  No change.  There are a few small 5-6 mm areas of excoriated skin along his forearms which appeared to be trauma, presumably from yard work.  Impression: Tolerating radiation therapy well.  Plan: Continue radiation therapy as planned.

## 2014-08-26 ENCOUNTER — Ambulatory Visit
Admission: RE | Admit: 2014-08-26 | Discharge: 2014-08-26 | Disposition: A | Discharge status: Home / Self Care | Payer: Medicare Other | Source: Ambulatory Visit | Attending: Radiation Oncology | Admitting: Radiation Oncology

## 2014-08-26 DIAGNOSIS — Z510 Encounter for antineoplastic radiation therapy: Secondary | ICD-10-CM | POA: Diagnosis not present

## 2014-08-27 ENCOUNTER — Ambulatory Visit
Admission: RE | Admit: 2014-08-27 | Discharge: 2014-08-27 | Disposition: A | Discharge status: Home / Self Care | Payer: Medicare Other | Source: Ambulatory Visit | Attending: Radiation Oncology | Admitting: Radiation Oncology

## 2014-08-27 DIAGNOSIS — Z510 Encounter for antineoplastic radiation therapy: Secondary | ICD-10-CM | POA: Diagnosis not present

## 2014-08-28 ENCOUNTER — Ambulatory Visit
Admission: RE | Admit: 2014-08-28 | Discharge: 2014-08-28 | Disposition: A | Discharge status: Home / Self Care | Payer: Medicare Other | Source: Ambulatory Visit | Attending: Radiation Oncology | Admitting: Radiation Oncology

## 2014-08-28 DIAGNOSIS — Z510 Encounter for antineoplastic radiation therapy: Secondary | ICD-10-CM | POA: Diagnosis not present

## 2014-08-29 ENCOUNTER — Encounter: Payer: Self-pay | Admitting: Radiation Oncology

## 2014-08-31 ENCOUNTER — Ambulatory Visit
Admission: RE | Admit: 2014-08-31 | Discharge: 2014-08-31 | Disposition: A | Discharge status: Home / Self Care | Payer: Medicare Other | Source: Ambulatory Visit | Attending: Radiation Oncology | Admitting: Radiation Oncology

## 2014-08-31 DIAGNOSIS — Z510 Encounter for antineoplastic radiation therapy: Secondary | ICD-10-CM | POA: Diagnosis not present

## 2014-08-31 DIAGNOSIS — C61 Malignant neoplasm of prostate: Secondary | ICD-10-CM

## 2014-08-31 NOTE — Progress Notes (Signed)
[name] has received 13 fractions to his Prostate.  Reports burning upon urination this weekend which has decrease today.  He voids once nightly which is his normal. Denies any retention. Noted diarrhea once on Friday, but attributes this to a pizza he ate.

## 2014-08-31 NOTE — Addendum Note (Signed)
Encounter addended by: Benn Moulder, RN on: 08/31/2014 11:55 AM<BR>     Documentation filed: Demographics Visit

## 2014-08-31 NOTE — Progress Notes (Signed)
Weekly Management Note:  Site: Prostate bed Current Dose:  2600  cGy Projected Dose: 6600  cGy  Narrative: The patient is seen today for routine under treatment assessment. CBCT/MVCT images/port films were reviewed. The chart was reviewed.   Bladder filling is satisfactory.  He does report more dysuria and urinary frequency, but this is not particularly bothersome.  Physical Examination: There were no vitals filed for this visit..  Weight:  .  No change.  Impression: Tolerating radiation therapy well.  Plan: Continue radiation therapy as planned.

## 2014-09-01 ENCOUNTER — Ambulatory Visit
Admission: RE | Admit: 2014-09-01 | Discharge: 2014-09-01 | Disposition: A | Discharge status: Home / Self Care | Payer: Medicare Other | Source: Ambulatory Visit | Attending: Radiation Oncology | Admitting: Radiation Oncology

## 2014-09-01 DIAGNOSIS — Z510 Encounter for antineoplastic radiation therapy: Secondary | ICD-10-CM | POA: Diagnosis not present

## 2014-09-02 ENCOUNTER — Ambulatory Visit
Admission: RE | Admit: 2014-09-02 | Discharge: 2014-09-02 | Disposition: A | Discharge status: Home / Self Care | Payer: Medicare Other | Source: Ambulatory Visit | Attending: Radiation Oncology | Admitting: Radiation Oncology

## 2014-09-02 DIAGNOSIS — Z510 Encounter for antineoplastic radiation therapy: Secondary | ICD-10-CM | POA: Diagnosis not present

## 2014-09-03 ENCOUNTER — Ambulatory Visit
Admission: RE | Admit: 2014-09-03 | Discharge: 2014-09-03 | Disposition: A | Discharge status: Home / Self Care | Payer: Medicare Other | Source: Ambulatory Visit | Attending: Radiation Oncology | Admitting: Radiation Oncology

## 2014-09-03 DIAGNOSIS — Z510 Encounter for antineoplastic radiation therapy: Secondary | ICD-10-CM | POA: Diagnosis not present

## 2014-09-04 ENCOUNTER — Ambulatory Visit
Admission: RE | Admit: 2014-09-04 | Discharge: 2014-09-04 | Disposition: A | Discharge status: Home / Self Care | Payer: Medicare Other | Source: Ambulatory Visit | Attending: Radiation Oncology | Admitting: Radiation Oncology

## 2014-09-04 DIAGNOSIS — Z510 Encounter for antineoplastic radiation therapy: Secondary | ICD-10-CM | POA: Diagnosis not present

## 2014-09-07 ENCOUNTER — Encounter: Payer: Self-pay | Admitting: Radiation Oncology

## 2014-09-07 ENCOUNTER — Ambulatory Visit
Admission: RE | Admit: 2014-09-07 | Discharge: 2014-09-07 | Disposition: A | Discharge status: Home / Self Care | Payer: Medicare Other | Source: Ambulatory Visit | Attending: Radiation Oncology | Admitting: Radiation Oncology

## 2014-09-07 VITALS — BP 108/70 | HR 69 | Ht 68.0 in | Wt 206.3 lb

## 2014-09-07 DIAGNOSIS — C61 Malignant neoplasm of prostate: Secondary | ICD-10-CM

## 2014-09-07 DIAGNOSIS — Z510 Encounter for antineoplastic radiation therapy: Secondary | ICD-10-CM | POA: Diagnosis not present

## 2014-09-07 NOTE — Progress Notes (Signed)
Mr. Siska has received 18 fractions to his pelvis for prostate cancer.  He reports rectal irritation as a level 1-2/10 with frequent soft stools and "gas". He also reports level 2/10 dysuria and nocturia every 2 hours last pm compared to hs norm. once nightly. Note decrease in energy level at the end of the week.

## 2014-09-07 NOTE — Progress Notes (Signed)
Weekly Management Note:  Site: Prostate bed Current Dose:  3600  cGy Projected Dose: 6600  cGy  Narrative: The patient is seen today for routine under treatment assessment. CBCT/MVCT images/port films were reviewed. The chart was reviewed.   Bladder filling is excellent today.  He did have fatigue over the weekend but is now less fatigue.  No new GU or GI difficulties.  He continues to have worsening urinary frequency/nocturia.  Physical Examination:  Filed Vitals:   09/07/14 1110  BP: 108/70  Pulse: 69  .  Weight: 206 lb 4.8 oz (93.577 kg).  No change.  Impression: Tolerating radiation therapy well.  Plan: Continue radiation therapy as planned.

## 2014-09-08 ENCOUNTER — Ambulatory Visit
Admission: RE | Admit: 2014-09-08 | Discharge: 2014-09-08 | Disposition: A | Discharge status: Home / Self Care | Payer: Medicare Other | Source: Ambulatory Visit | Attending: Radiation Oncology | Admitting: Radiation Oncology

## 2014-09-08 DIAGNOSIS — Z510 Encounter for antineoplastic radiation therapy: Secondary | ICD-10-CM | POA: Diagnosis not present

## 2014-09-09 ENCOUNTER — Ambulatory Visit
Admission: RE | Admit: 2014-09-09 | Discharge: 2014-09-09 | Disposition: A | Discharge status: Home / Self Care | Payer: Medicare Other | Source: Ambulatory Visit | Attending: Radiation Oncology | Admitting: Radiation Oncology

## 2014-09-09 DIAGNOSIS — Z510 Encounter for antineoplastic radiation therapy: Secondary | ICD-10-CM | POA: Diagnosis not present

## 2014-09-10 ENCOUNTER — Ambulatory Visit
Admission: RE | Admit: 2014-09-10 | Discharge: 2014-09-10 | Disposition: A | Discharge status: Home / Self Care | Payer: Medicare Other | Source: Ambulatory Visit | Attending: Radiation Oncology | Admitting: Radiation Oncology

## 2014-09-10 DIAGNOSIS — Z510 Encounter for antineoplastic radiation therapy: Secondary | ICD-10-CM | POA: Diagnosis not present

## 2014-09-11 ENCOUNTER — Ambulatory Visit
Admission: RE | Admit: 2014-09-11 | Discharge: 2014-09-11 | Disposition: A | Discharge status: Home / Self Care | Payer: Medicare Other | Source: Ambulatory Visit | Attending: Radiation Oncology | Admitting: Radiation Oncology

## 2014-09-11 DIAGNOSIS — Z510 Encounter for antineoplastic radiation therapy: Secondary | ICD-10-CM | POA: Diagnosis not present

## 2014-09-14 ENCOUNTER — Ambulatory Visit
Admission: RE | Admit: 2014-09-14 | Discharge: 2014-09-14 | Disposition: A | Discharge status: Home / Self Care | Payer: Medicare Other | Source: Ambulatory Visit | Attending: Radiation Oncology | Admitting: Radiation Oncology

## 2014-09-14 ENCOUNTER — Encounter: Payer: Self-pay | Admitting: Radiation Oncology

## 2014-09-14 VITALS — BP 114/78 | HR 67 | Temp 98.3°F | Ht 68.0 in | Wt 207.7 lb

## 2014-09-14 DIAGNOSIS — C61 Malignant neoplasm of prostate: Secondary | ICD-10-CM

## 2014-09-14 DIAGNOSIS — Z510 Encounter for antineoplastic radiation therapy: Secondary | ICD-10-CM | POA: Diagnosis not present

## 2014-09-14 NOTE — Progress Notes (Signed)
Curtis [name] reports that he continues to have a level 2/10 burn upon urination with complete emptying.  Nocturia x 2. Reports no diarrhea this weekend and now has frequent softer stools.  Fatigue present.

## 2014-09-14 NOTE — Progress Notes (Signed)
Weekly Management Note:  Site: Prostate bed Current Dose:  4600  cGy Projected Dose: 6600  cGy  Narrative: The patient is seen today for routine under treatment assessment. CBCT/MVCT images/port films were reviewed. The chart was reviewed.   Bladder filling is suboptimal but satisfactory.  He continues to have dysuria.  He admits to nocturia 2.  No GI difficulties.    Physical Examination:  Filed Vitals:   09/14/14 1128  BP: 114/78  Pulse: 67  Temp: 98.3 F (36.8 C)  .  Weight: 207 lb 11.2 oz (94.212 kg).  No change.  Impression: Tolerating radiation therapy well.  Plan: Continue radiation therapy as planned.

## 2014-09-15 ENCOUNTER — Ambulatory Visit
Admission: RE | Admit: 2014-09-15 | Discharge: 2014-09-15 | Disposition: A | Discharge status: Home / Self Care | Payer: Medicare Other | Source: Ambulatory Visit | Attending: Radiation Oncology | Admitting: Radiation Oncology

## 2014-09-15 DIAGNOSIS — Z510 Encounter for antineoplastic radiation therapy: Secondary | ICD-10-CM | POA: Diagnosis not present

## 2014-09-16 ENCOUNTER — Ambulatory Visit
Admission: RE | Admit: 2014-09-16 | Discharge: 2014-09-16 | Disposition: A | Discharge status: Home / Self Care | Payer: Medicare Other | Source: Ambulatory Visit | Attending: Radiation Oncology | Admitting: Radiation Oncology

## 2014-09-16 DIAGNOSIS — Z510 Encounter for antineoplastic radiation therapy: Secondary | ICD-10-CM | POA: Diagnosis not present

## 2014-09-17 ENCOUNTER — Ambulatory Visit
Admission: RE | Admit: 2014-09-17 | Discharge: 2014-09-17 | Disposition: A | Discharge status: Home / Self Care | Payer: Medicare Other | Source: Ambulatory Visit | Attending: Radiation Oncology | Admitting: Radiation Oncology

## 2014-09-17 DIAGNOSIS — Z510 Encounter for antineoplastic radiation therapy: Secondary | ICD-10-CM | POA: Diagnosis not present

## 2014-09-18 ENCOUNTER — Ambulatory Visit
Admission: RE | Admit: 2014-09-18 | Discharge: 2014-09-18 | Disposition: A | Discharge status: Home / Self Care | Payer: Medicare Other | Source: Ambulatory Visit | Attending: Radiation Oncology | Admitting: Radiation Oncology

## 2014-09-18 DIAGNOSIS — Z510 Encounter for antineoplastic radiation therapy: Secondary | ICD-10-CM | POA: Diagnosis not present

## 2014-09-21 ENCOUNTER — Ambulatory Visit
Admission: RE | Admit: 2014-09-21 | Discharge: 2014-09-21 | Disposition: A | Discharge status: Home / Self Care | Payer: Medicare Other | Source: Ambulatory Visit | Attending: Radiation Oncology | Admitting: Radiation Oncology

## 2014-09-21 DIAGNOSIS — C61 Malignant neoplasm of prostate: Secondary | ICD-10-CM

## 2014-09-21 DIAGNOSIS — Z510 Encounter for antineoplastic radiation therapy: Secondary | ICD-10-CM | POA: Diagnosis not present

## 2014-09-21 NOTE — Progress Notes (Signed)
Weekly Management Note:  Site: Prostate Current Dose:  5600  cGy Projected Dose: 6600  cGy  Narrative: The patient is seen today for routine under treatment assessment. CBCT/MVCT images/port films were reviewed. The chart was reviewed.   Bladder filling is excellent today.  No change in his urinary frequency/urgency.  His bowels are somewhat loose.  Physical Examination: There were no vitals filed for this visit..  Weight:  .  No change.  Impression: Tolerating radiation therapy well.  He may be treated twice a day this Friday so we can complete his treatment before the weekend.  Plan: Continue radiation therapy as planned.

## 2014-09-22 ENCOUNTER — Ambulatory Visit
Admission: RE | Admit: 2014-09-22 | Discharge: 2014-09-22 | Disposition: A | Discharge status: Home / Self Care | Payer: Medicare Other | Source: Ambulatory Visit | Attending: Radiation Oncology | Admitting: Radiation Oncology

## 2014-09-22 DIAGNOSIS — Z510 Encounter for antineoplastic radiation therapy: Secondary | ICD-10-CM | POA: Diagnosis not present

## 2014-09-23 ENCOUNTER — Ambulatory Visit
Admission: RE | Admit: 2014-09-23 | Discharge: 2014-09-23 | Disposition: A | Discharge status: Home / Self Care | Payer: Medicare Other | Source: Ambulatory Visit | Attending: Radiation Oncology | Admitting: Radiation Oncology

## 2014-09-23 DIAGNOSIS — Z510 Encounter for antineoplastic radiation therapy: Secondary | ICD-10-CM | POA: Diagnosis not present

## 2014-09-24 ENCOUNTER — Ambulatory Visit
Admission: RE | Admit: 2014-09-24 | Discharge: 2014-09-24 | Disposition: A | Discharge status: Home / Self Care | Payer: Medicare Other | Source: Ambulatory Visit | Attending: Radiation Oncology | Admitting: Radiation Oncology

## 2014-09-24 DIAGNOSIS — Z510 Encounter for antineoplastic radiation therapy: Secondary | ICD-10-CM | POA: Diagnosis not present

## 2014-09-25 ENCOUNTER — Ambulatory Visit
Admission: RE | Admit: 2014-09-25 | Discharge: 2014-09-25 | Disposition: A | Discharge status: Home / Self Care | Payer: Medicare Other | Source: Ambulatory Visit | Attending: Radiation Oncology | Admitting: Radiation Oncology

## 2014-09-25 VITALS — BP 125/69 | HR 69 | Temp 97.8°F | Wt 209.6 lb

## 2014-09-25 DIAGNOSIS — Z510 Encounter for antineoplastic radiation therapy: Secondary | ICD-10-CM | POA: Diagnosis not present

## 2014-09-25 DIAGNOSIS — C61 Malignant neoplasm of prostate: Secondary | ICD-10-CM

## 2014-09-25 NOTE — Progress Notes (Signed)
  Weekly Management Note:  Site: Prostate Current Dose:  6600  cGy Projected Dose: 6600  cGy  Narrative: The patient is seen today for routine under treatment assessment. CBCT/MVCT images/port films were reviewed. The chart was reviewed.   No new complaints; fatigued  Physical Examination:  Filed Vitals:   09/25/14 1627  BP: 125/69  Pulse: 69  Temp: 97.8 F (36.6 C)  .  Weight: 209 lb 9.6 oz (95.074 kg).  NAD.  Impression: Tolerated radiation therapy well.    Plan:f/u in 35mo with Dr Valere Dross -----------------------------------  Eppie Gibson, MD

## 2014-09-25 NOTE — Progress Notes (Signed)
Patient completed 33 treatments to prostate.Denies pain.Continued urinary frequency and urgency.Nocturia x 2.Stools loose but not watery.

## 2014-09-26 ENCOUNTER — Encounter: Payer: Self-pay | Admitting: Radiation Oncology

## 2014-09-26 NOTE — Progress Notes (Signed)
Chart note: On 08/12/2014 the patient began his radiotherapy with dual VMAT IMRT.  He was treated with 2 sets of dynamic MLCs corresponding to one set of IMRT treatment devices [id number])

## 2014-09-26 NOTE — Progress Notes (Signed)
[facility] End of Treatment Note  Name:Curtis [name]  Date: 09/26/2014 XYI:[id number] DOB:[birth year]   Status:outpatient    CC: Gennette Pac, MD  Dr. Rana Snare  REFERRING PHYSICIAN:  Dr. Rana Snare   DIAGNOSIS: Pathologic stage T3b PSA recurrent carcinoma the prostate   INDICATION FOR TREATMENT: Curative   TREATMENT DATES: 08/12/2014 through 09/25/2014                           SITE/DOSE:   Prostate bed 6600 cGy in 33 sessions                         BEAMS/ENERGY:    Dual ARC VMAT IMRT with 6 MV photons               NARRATIVE: Mr. Nehme had a difficult time during his course of therapy with inconsistent filling of his urinary bladder secondary to urinary urgency.  He also had dysuria during the latter portion of his treatment.                           PLAN: Routine followup in one month. Patient instructed to call if questions or worsening complaints in interim.

## 2014-10-29 ENCOUNTER — Encounter: Payer: Self-pay | Admitting: Radiation Oncology

## 2014-11-03 ENCOUNTER — Ambulatory Visit: Admission: RE | Admit: 2014-11-03 | Payer: Medicare Other | Source: Ambulatory Visit | Admitting: Radiation Oncology

## 2014-11-03 HISTORY — DX: Personal history of irradiation: Z92.3

## 2014-11-10 ENCOUNTER — Ambulatory Visit: Payer: Medicare Other | Admitting: Radiation Oncology

## 2014-11-12 ENCOUNTER — Ambulatory Visit
Admission: RE | Admit: 2014-11-12 | Discharge: 2014-11-12 | Disposition: A | Discharge status: Home / Self Care | Payer: Medicare Other | Source: Ambulatory Visit | Attending: Radiation Oncology | Admitting: Radiation Oncology

## 2014-11-12 ENCOUNTER — Encounter: Payer: Self-pay | Admitting: Radiation Oncology

## 2014-11-12 VITALS — BP 131/69 | HR 60 | Temp 97.8°F | Ht 68.0 in | Wt 205.7 lb

## 2014-11-12 DIAGNOSIS — C61 Malignant neoplasm of prostate: Secondary | ICD-10-CM

## 2014-11-12 NOTE — Progress Notes (Signed)
Curtis [name] reports "senstitivity at the end of his urinary stream", but better that at the end of his treatment phase.  Has dribbling at end of stream and intermittent episodes of dribbling.  Nocturia x 1-2.  Denies any loose, nor diarrheal stools and his external hemmorhoid is regressing.  Now working full-time.

## 2014-11-12 NOTE — Progress Notes (Signed)
CC: Dr. Hulan Fess, Dr. Rana Snare  Follow-up note:  Curtis [name] returns today approximately 6 weeks following completion of radiation therapy in the management of his PSA recurrent carcinoma the prostate.  He feels that his urinary habits are returning to normal.  He still has some hemorrhoidal irritation but no rectal bleeding.  He is pleased with his progress.  He does not yet have an appointment to see Dr. Risa Grill.  Physical examination: Alert and oriented. Filed Vitals:   11/12/14 1156  BP: 131/69  Pulse: 60  Temp: 97.8 F (36.6 C)   Rectal examination not performed today.  Impression: Satisfactory progress.  He tells me he will have Dr. Rex Kras obtain his follow-up PSA, and I told him that this should be done approximate by  3-4 months following completion of radiation therapy.  He will forward the results to Dr. Risa Grill and me.  Plan: As above.  Urologic follow-up through Dr. Risa Grill.  I've not scheduled Curtis [name] for formal follow-up visit, but I would be more than happy to see him again in the future should the need arise.

## 2014-11-13 NOTE — Addendum Note (Signed)
Encounter addended by: Benn Moulder, RN on: 11/13/2014  8:12 PM<BR>     Documentation filed: Charges VN

## 2015-02-19 ENCOUNTER — Other Ambulatory Visit: Payer: Self-pay | Admitting: Family Medicine

## 2015-02-19 DIAGNOSIS — R911 Solitary pulmonary nodule: Secondary | ICD-10-CM

## 2015-03-02 ENCOUNTER — Ambulatory Visit
Admission: RE | Admit: 2015-03-02 | Discharge: 2015-03-02 | Disposition: A | Discharge status: Home / Self Care | Payer: Medicare Other | Source: Ambulatory Visit | Attending: Family Medicine | Admitting: Family Medicine

## 2015-03-02 DIAGNOSIS — R911 Solitary pulmonary nodule: Secondary | ICD-10-CM

## 2015-03-02 MED ORDER — IOPAMIDOL (ISOVUE-300) INJECTION 61%
75.0000 mL | Freq: Once | INTRAVENOUS | Status: AC | PRN
  Administered 2015-03-02: 75 mL via INTRAVENOUS

## 2015-03-04 ENCOUNTER — Other Ambulatory Visit: Payer: Self-pay | Admitting: Family Medicine

## 2015-03-04 DIAGNOSIS — E041 Nontoxic single thyroid nodule: Secondary | ICD-10-CM

## 2015-03-11 ENCOUNTER — Ambulatory Visit
Admission: RE | Admit: 2015-03-11 | Discharge: 2015-03-11 | Disposition: A | Discharge status: Home / Self Care | Payer: Medicare Other | Source: Ambulatory Visit | Attending: Family Medicine | Admitting: Family Medicine

## 2015-03-11 DIAGNOSIS — E041 Nontoxic single thyroid nodule: Secondary | ICD-10-CM

## 2015-03-15 ENCOUNTER — Encounter: Payer: Self-pay | Admitting: Adult Health

## 2015-03-15 ENCOUNTER — Ambulatory Visit (INDEPENDENT_AMBULATORY_CARE_PROVIDER_SITE_OTHER): Payer: Medicare Other | Admitting: Adult Health

## 2015-03-15 VITALS — BP 116/74 | HR 60 | Temp 98.0°F | Ht 68.0 in | Wt 202.0 lb

## 2015-03-15 DIAGNOSIS — J45909 Unspecified asthma, uncomplicated: Secondary | ICD-10-CM

## 2015-03-15 DIAGNOSIS — R911 Solitary pulmonary nodule: Secondary | ICD-10-CM | POA: Diagnosis not present

## 2015-03-15 DIAGNOSIS — J449 Chronic obstructive pulmonary disease, unspecified: Secondary | ICD-10-CM | POA: Diagnosis not present

## 2015-03-15 MED ORDER — UMECLIDINIUM BROMIDE 62.5 MCG/INH IN AEPB: 1.0000 | INHALATION_SPRAY | Freq: Every day | RESPIRATORY_TRACT | Status: DC

## 2015-03-15 NOTE — Patient Instructions (Addendum)
Continue on Symbicort 160/4.76mcg 2 puffs Twice daily   Add Incruse 1 puff daily  Follow up Dr. Lake Bells in 2-3 months and As needed

## 2015-03-15 NOTE — Progress Notes (Signed)
   Subjective:    Patient ID: Curtis [name], male    DOB: 08-08-1945, 69 y.o.   MRN: [id number]  HPI  69 year old male former smoker with gold to COPD with asthma component    03/15/2015 follow-up COPD and lung nodule Pt returns for 1 year follow up and to review recent CT chest .  Pt says his breathing is about the same. Does have sob with activities .  Remains active. Does not exercise on reg basis . Plays golf . Does yard work (wears mask) .  Has cough most days. Gets congested on/off.  He is a former smoker . Owns [employer].  Remains on symbicort. Takes regular .  Flu shot utd.  Pneumovax utd. Plans to get Prevnar next month at PCP.  Spirometry FEV1 68%, ratio 67 (mid flows 43%) He denies any chest pain, orthopnea, PND, leg swelling or hemoptysis.  Patient had a  CT abdomen and pelvis in March 2016 that showed an incidental finding of a 7 mm lung nodule along the left lower lobe.   CT chest was done on 03/02/2015 that showed near resolved 7 mm lung nodule in the left lower lobe. There was persistent bronchial wall thickening and centrilobular nodules with a tree-in-bud characteristics. And a new 4 mm left upper lobe pulmonary nodule.  Review of Systems Constitutional:   No  weight loss, night sweats,  Fevers, chills, fatigue, or  lassitude.  HEENT:   No headaches,  Difficulty swallowing,  Tooth/dental problems, or  Sore throat,                No sneezing, itching, ear ache, nasal congestion, post nasal drip,   CV:  No chest pain,  Orthopnea, PND, swelling in lower extremities, anasarca, dizziness, palpitations, syncope.   GI  No heartburn, indigestion, abdominal pain, nausea, vomiting, diarrhea, change in bowel habits, loss of appetite, bloody stools.   Resp: No chest wall deformity  Skin: no rash or lesions.  GU: no dysuria, change in color of urine, no urgency or frequency.  No flank pain, no hematuria   MS:  No joint pain or swelling.  No decreased range of motion.   No back pain.  Psych:  No change in mood or affect. No depression or anxiety.  No memory loss.         Objective:   Physical Exam  GEN: A/Ox3; pleasant , NAD, well nourished   HEENT:  [place]/AT,  EACs-clear, TMs-wnl, NOSE-clear, THROAT-clear, no lesions, no postnasal drip or exudate noted.   NECK:  Supple w/ fair ROM; no JVD; normal carotid impulses w/o bruits; no thyromegaly or nodules palpated; no lymphadenopathy.  RESP  Few trace exp wheezes , no accessory muscle use, no dullness to percussion  CARD:  RRR, no m/r/g  , no peripheral edema, pulses intact, no cyanosis or clubbing.  GI:   Soft & nt; nml bowel sounds; no organomegaly or masses detected.  Musco: Warm bil, no deformities or joint swelling noted.   Neuro: alert, no focal deficits noted.    Skin: Warm, no lesions or rashes        Assessment & Plan:

## 2015-03-15 NOTE — Assessment & Plan Note (Signed)
LUL 4 mm nodule in former smoker  Repeat CT chest without contrast in year.

## 2015-03-15 NOTE — Progress Notes (Signed)
Reviewed, I agree with this plan of care Given symptoms, may need a bronchoscopy

## 2015-03-15 NOTE — Addendum Note (Signed)
Addended by: Osa Craver on: 03/15/2015 01:33 PM   Modules accepted: Orders

## 2015-03-15 NOTE — Addendum Note (Signed)
Addended by: Osa Craver on: 03/15/2015 01:35 PM   Modules accepted: Orders

## 2015-03-15 NOTE — Assessment & Plan Note (Addendum)
Not optimal control , lung function similar to last year.  CT chest on 10/18 does show persistent bronchial wall thickening w/ centrilobular nodules with tree in bud characteristics  Will discuss with Dr. Lake Bells .  Will try LAMA   Plan  Continue on Symbicort 160/4.14mcg 2 puffs Twice daily   Add Incruse 1 puff daily  Follow up Dr. [place] Bells in 2-3 months and As needed

## 2015-03-17 ENCOUNTER — Telehealth: Payer: Self-pay | Admitting: Pulmonary Disease

## 2015-03-17 NOTE — Telephone Encounter (Signed)
PA initiated by [facility] for Incruse on [employer] Information completed in covermymeds Awaiting approval or denial Will hold in triage until received

## 2015-03-18 NOTE — Telephone Encounter (Signed)
Will forward to [facility]

## 2015-03-19 NOTE — Telephone Encounter (Signed)
PA was not submitted to plan. I have submitted PA to plan. Will forward to [place] to f/u on denial/approval

## 2015-03-22 NOTE — Telephone Encounter (Signed)
Checked the status of PA on covermymeds. No determination has been made yet.

## 2015-03-23 MED ORDER — TIOTROPIUM BROMIDE-OLODATEROL 2.5-2.5 MCG/ACT IN AERS: 2.0000 | INHALATION_SPRAY | Freq: Every day | RESPIRATORY_TRACT | Status: DC

## 2015-03-23 NOTE — Telephone Encounter (Signed)
Stiolto 2 puff daily, but he needs to stop Symbicort if he starts Stiolto to avoid overdosing on long acting beta agonists

## 2015-03-23 NOTE — Telephone Encounter (Signed)
This telephone encounter has been closed ------------------------------------------------------- Called [facility] at [facility], non-formulary Drug, Must try and fail 2 of the following:  Anoro, Serevent, Stiolto  Dr. [place] Bells, which drug would you like to have patient try?  Please advise.

## 2015-03-23 NOTE — Addendum Note (Signed)
Addended by: Virl Cagey on: 03/23/2015 03:18 PM   Modules accepted: Orders

## 2015-03-23 NOTE — Telephone Encounter (Addendum)
[employer], Rx for Stiolto called in. Aware that we will contact the patient to make aware of this change and instructions regarding discontinuing Symbicort while on Stiolto.  Called and LM for pt to return call - needs to stop Symbicort when starting Stiolto.  Will hold in triage to follow up as this message has been closed.

## 2015-03-24 NOTE — Telephone Encounter (Signed)
Spoke with pt and made him aware of the below results.  Pt is unsure if he wants to start the [place].  Pt is interested in starting spiriva instead of stiolto.  Pt is requesting that BQ call him to discuss medication alternatives as he is wary of starting a new inhaler just because his insurance states he should.  BQ please advise if you are able to call patient to discuss this.  Thanks!

## 2015-03-25 NOTE — Telephone Encounter (Signed)
I called Curtis [name] to explain why I had chosen Stiolto. However, he was unavailable to speak so I believe a message. Specifically, I will let him know that I am choosing a medication that is on his formulary that I think is appropriate for his disease and not just letting the insurance company pick the medication. I've asked him to let us know and would be a good time for me to call him back if necessary.

## 2015-03-26 ENCOUNTER — Telehealth: Payer: Self-pay | Admitting: Pulmonary Disease

## 2015-03-26 NOTE — Telephone Encounter (Signed)
Will route message to BQ to return pt's call.

## 2015-03-29 NOTE — Telephone Encounter (Signed)
Please let him know that I am on night float this week Need evening times when he can be reached and a number to call him

## 2015-03-30 MED ORDER — TIOTROPIUM BROMIDE MONOHYDRATE 18 MCG IN CAPS: 18.0000 ug | ORAL_CAPSULE | Freq: Every day | RESPIRATORY_TRACT | Status: DC

## 2015-03-30 NOTE — Telephone Encounter (Signed)
I called tonight and had to leave a message because he did not answer.  I explained to him (in the message) that Lynelle Smoke is trying to add another inhaler to improve his control of COPD but his insurance company is greatly limiting what we offer. For now it's probably best to just stay on the Symbicort, but if he and I can have an opportunity to talk about the change then we can change his medications at that point. It sounds like he really wants to go over the rationale for the change before we make any changes and up until this point I've been unable to reach him despite a few attempts with phone calls.  Can we try to get him in to see me in 3-4 weeks or so?

## 2015-03-30 NOTE — Telephone Encounter (Signed)
Spoke with pt, states that he is available on his work # between 5:30 and 6:00 at [phone], and anytime after 7:00 at [phone].  Needs to know: 1. The drug 2. The dosage 3. Is it replacing what he's currently on.   BQ please advise when this is completed.  Thanks!

## 2015-03-30 NOTE — Telephone Encounter (Signed)
He called me back tonight.  I have added on Spiriva an sent in an Rx  If he likes it he will call us to change to a 3 months supply through a mail in pharmacy.

## 2015-03-31 NOTE — Telephone Encounter (Signed)
BQ, you're booked 100% in clinic until 06/14/2015.  Would you like him to be scheduled next available, or double booked?  If you prefer a double book, please advise if there's any days you can't do.    Thanks!

## 2015-04-05 NOTE — Telephone Encounter (Signed)
Since he and I spoke he doesn't need another appointment other than what he already has scheduled

## 2015-06-02 ENCOUNTER — Other Ambulatory Visit: Payer: Self-pay | Admitting: Pulmonary Disease

## 2015-06-02 MED ORDER — ALBUTEROL SULFATE HFA 108 (90 BASE) MCG/ACT IN AERS: 2.0000 | INHALATION_SPRAY | Freq: Four times a day (QID) | RESPIRATORY_TRACT | Status: DC | PRN

## 2015-12-27 ENCOUNTER — Other Ambulatory Visit: Payer: Self-pay | Admitting: Pulmonary Disease

## 2016-01-31 ENCOUNTER — Telehealth: Payer: Self-pay | Admitting: Pulmonary Disease

## 2016-01-31 NOTE — Telephone Encounter (Signed)
Per chart pt is not on Incruse. Per chart pt has been prescribed Symbicort, Spiriva and Stiolto. Please clarify which inhaler pt needs refill on.   LMTCB

## 2016-02-01 MED ORDER — TIOTROPIUM BROMIDE-OLODATEROL 2.5-2.5 MCG/ACT IN AERS: 2.0000 | INHALATION_SPRAY | Freq: Every day | RESPIRATORY_TRACT | 2 refills | Status: DC

## 2016-02-01 NOTE — Telephone Encounter (Signed)
Spoke with pt. He needs refills on Stiolto. He has an upcoming appointment with BQ in November. Rx has been sent in. Nothing further was needed.

## 2016-03-14 ENCOUNTER — Inpatient Hospital Stay: Admission: RE | Admit: 2016-03-14 | Payer: Medicare Other | Source: Ambulatory Visit

## 2016-03-16 ENCOUNTER — Ambulatory Visit: Payer: Medicare Other | Admitting: Pulmonary Disease

## 2016-03-21 ENCOUNTER — Ambulatory Visit (INDEPENDENT_AMBULATORY_CARE_PROVIDER_SITE_OTHER)
Admission: RE | Admit: 2016-03-21 | Discharge: 2016-03-21 | Disposition: A | Discharge status: Home / Self Care | Payer: Medicare Other | Source: Ambulatory Visit | Attending: Adult Health | Admitting: Adult Health

## 2016-03-21 DIAGNOSIS — R911 Solitary pulmonary nodule: Secondary | ICD-10-CM

## 2016-03-29 ENCOUNTER — Ambulatory Visit (INDEPENDENT_AMBULATORY_CARE_PROVIDER_SITE_OTHER): Payer: Medicare Other | Admitting: Pulmonary Disease

## 2016-03-29 ENCOUNTER — Encounter: Payer: Self-pay | Admitting: Pulmonary Disease

## 2016-03-29 DIAGNOSIS — J449 Chronic obstructive pulmonary disease, unspecified: Secondary | ICD-10-CM

## 2016-03-29 DIAGNOSIS — J4489 Other specified chronic obstructive pulmonary disease: Secondary | ICD-10-CM

## 2016-03-29 DIAGNOSIS — R911 Solitary pulmonary nodule: Secondary | ICD-10-CM

## 2016-03-29 MED ORDER — TIOTROPIUM BROMIDE-OLODATEROL 2.5-2.5 MCG/ACT IN AERS: 2.0000 | INHALATION_SPRAY | Freq: Every day | RESPIRATORY_TRACT | 11 refills | Status: DC

## 2016-03-29 NOTE — Progress Notes (Signed)
Subjective:    Patient ID: Curtis [name], male    DOB: 1946/01/28, 70 y.o.   MRN: [id number]  Synopsis: Chronic obstructive asthma and COPD first seen in 2014 . Previously followed by Dr. Velora Heckler here in town, had severe asthma as a child. Smoked 0.5 ppd for about 30 years, quit in 1990's Previously followed by Dr. Caprice Red 12/31/2013 Spirometry> Ratio 60%, FEV1 2.37L (71% pred)  HPI  Chief Complaint  Patient presents with  . Follow-up    pt c/o stable prod cough with yellow mucus, sob with exertion, chest tightness.  CAT score 13   Curtis [name] says that he hsa been OK.  He is remaining active, still playing golf regularly.  He gets out of breath when he does some activtiy but doesn't feel inhibited by the dyspnea.  He was able to blow leaves yesterday without difficulty.  He coughs up more yellow mucus lately, all day long.  No weight loss, fevers chills.  He continues to take his Stiolto.  He says it is working OK for him.  No real wheezing on this much.  No exacerbations in the last year.  Not needed prednisone.  Not much rescue inhaler use, perhaps 1 time per week or every other week.    Past Medical History:  Diagnosis Date  . Asthma   . COPD (chronic obstructive pulmonary disease) ([place])   . Prostate cancer ([place])   . S/P radiation therapy 08/12/2014 through 09/25/2014     Prostate bed 6600 cGy in 33 sessions   . Shortness of breath    with exertion     Review of Systems  Constitutional: Positive for fatigue. Negative for chills and fever.  HENT: Negative for postnasal drip, rhinorrhea and sinus pressure.   Respiratory: Positive for cough and shortness of breath. Negative for wheezing.   Cardiovascular: Negative for chest pain, palpitations and leg swelling.  Psychiatric/Behavioral: Negative for suicidal ideas (.dbmexam).       Objective:   Physical Exam Vitals:   03/29/16 0934  BP: 124/68   BP Location: Left Arm  Cuff Size: Normal  Pulse: 67  SpO2: 96%  Weight: 203 lb (92.1 kg)  Height: 5\' 8"  (1.727 m)   RA  Gen: well appearing, no acute distress HEENT: NCAT, EOMi, OP clear, neck supple without masses PULM: wheezing upper lobes, rhonchi, crackles RLL CV: RRR, no mgr, no JVD AB: BS+, soft, nontender, no hsm Ext: warm, no edema, no clubbing, no cyanosis Derm: no rash or skin breakdown Neuro: A&Ox4, MAEW   November 2017 CT chest images personally reviewed showing resolution of the right upper lobe nodule but there are an increased amount of airway thickening bilaterally with some tree in bud abnormalities  Records from his last visit with our nurse practitioner reviewed where he was evaluated for pulmonary nodule and COPD       Assessment & Plan:   COPD with asthma ([place]) He is doing well with Stiolto though he does have an increased burden of chronic bronchitis symptoms which is related to his lifelong asthma I believe. Based on the review of his CT scan I wonder about the possibility of chronic aspiration or an occult infection but he claims that his symptoms have been stable for years so a workup for that at this time is not merited.  He refuses the flu shot today  He's tolerating Stiolto well.  Plan: Continue Stiolto Get a flu shot If symptoms of dyspnea or chest congestion increase then  let me know and I will consider a swallowing evaluation and test his mucus for chronic infection Use Mucinex twice a day Follow-up 6 months  Lung nodule Resolved.  > 50% of this 26 minute visit spent face to face  Updated Medication List Outpatient Encounter Prescriptions as of 03/29/2016  Medication Sig  . acetaminophen (TYLENOL) 325 MG tablet Take 650 mg by mouth every 6 (six) hours as needed.  Marland Kitchen albuterol (PROVENTIL HFA;VENTOLIN HFA) 108 (90 Base) MCG/ACT inhaler Inhale 2 puffs into the lungs every 6 (six) hours as needed. For shortness of breath  . aspirin 325 MG  tablet Take 325 mg by mouth daily.  Marland Kitchen ibuprofen (ADVIL,MOTRIN) 200 MG tablet Take 200 mg by mouth every 6 (six) hours as needed.  Marland Kitchen POTASSIUM PO Take by mouth. Pt takes as needed, when he "sweats a lot". Takes to prevent cramping.  . tadalafil (CIALIS) 10 MG tablet Take 10 mg by mouth daily as needed for erectile dysfunction.  . Tiotropium Bromide-Olodaterol (STIOLTO RESPIMAT) 2.5-2.5 MCG/ACT AERS Inhale 2 puffs into the lungs daily.  . [DISCONTINUED] Tiotropium Bromide-Olodaterol (STIOLTO RESPIMAT) 2.5-2.5 MCG/ACT AERS Inhale 2 puffs into the lungs daily.  . [DISCONTINUED] budesonide-formoterol (SYMBICORT) 160-4.5 MCG/ACT inhaler Inhale 2 puffs into the lungs 2 (two) times daily. (Patient not taking: Reported on 03/29/2016)  . [DISCONTINUED] tiotropium (SPIRIVA HANDIHALER) 18 MCG inhalation capsule Place 1 capsule (18 mcg total) into inhaler and inhale daily. (Patient not taking: Reported on 03/29/2016)   No facility-administered encounter medications on file as of 03/29/2016.

## 2016-03-29 NOTE — Assessment & Plan Note (Signed)
He is doing well with Stiolto though he does have an increased burden of chronic bronchitis symptoms which is related to his lifelong asthma I believe. Based on the review of his CT scan I wonder about the possibility of chronic aspiration or an occult infection but he claims that his symptoms have been stable for years so a workup for that at this time is not merited.  He refuses the flu shot today  He's tolerating Stiolto well.  Plan: Continue Stiolto Get a flu shot If symptoms of dyspnea or chest congestion increase then let me know and I will consider a swallowing evaluation and test his mucus for chronic infection Use Mucinex twice a day Follow-up 6 months

## 2016-03-29 NOTE — Assessment & Plan Note (Signed)
Resolved

## 2016-03-29 NOTE — Patient Instructions (Signed)
Let us know if your mucus production or shortness of breath increases Keep taking Stiolto daily Get a flu shot I will see you back in 6 months or sooner if needed

## 2016-07-02 ENCOUNTER — Encounter (HOSPITAL_COMMUNITY): Payer: Self-pay | Admitting: Emergency Medicine

## 2016-07-02 ENCOUNTER — Emergency Department (HOSPITAL_COMMUNITY): Payer: Medicare Other

## 2016-07-02 ENCOUNTER — Observation Stay (HOSPITAL_COMMUNITY)
Admission: EM | Admit: 2016-07-02 | Discharge: 2016-07-03 | Disposition: A | Discharge status: Home / Self Care | Payer: Medicare Other | Attending: Internal Medicine | Admitting: Internal Medicine

## 2016-07-02 DIAGNOSIS — I10 Essential (primary) hypertension: Secondary | ICD-10-CM | POA: Diagnosis not present

## 2016-07-02 DIAGNOSIS — Z87891 Personal history of nicotine dependence: Secondary | ICD-10-CM | POA: Insufficient documentation

## 2016-07-02 DIAGNOSIS — J449 Chronic obstructive pulmonary disease, unspecified: Secondary | ICD-10-CM | POA: Diagnosis not present

## 2016-07-02 DIAGNOSIS — G454 Transient global amnesia: Secondary | ICD-10-CM

## 2016-07-02 DIAGNOSIS — C61 Malignant neoplasm of prostate: Secondary | ICD-10-CM | POA: Diagnosis not present

## 2016-07-02 DIAGNOSIS — R739 Hyperglycemia, unspecified: Secondary | ICD-10-CM | POA: Diagnosis not present

## 2016-07-02 DIAGNOSIS — Z8546 Personal history of malignant neoplasm of prostate: Secondary | ICD-10-CM | POA: Diagnosis not present

## 2016-07-02 DIAGNOSIS — Z7982 Long term (current) use of aspirin: Secondary | ICD-10-CM | POA: Insufficient documentation

## 2016-07-02 DIAGNOSIS — R411 Anterograde amnesia: Secondary | ICD-10-CM | POA: Diagnosis not present

## 2016-07-02 DIAGNOSIS — J4489 Other specified chronic obstructive pulmonary disease: Secondary | ICD-10-CM | POA: Diagnosis present

## 2016-07-02 DIAGNOSIS — R410 Disorientation, unspecified: Secondary | ICD-10-CM | POA: Diagnosis present

## 2016-07-02 LAB — I-STAT CHEM 8, ED
BUN: 21 mg/dL — ABNORMAL HIGH (ref 6–20)
CHLORIDE: 104 mmol/L (ref 101–111)
CREATININE: 1.1 mg/dL (ref 0.61–1.24)
Calcium, Ion: 1.13 mmol/L — ABNORMAL LOW (ref 1.15–1.40)
GLUCOSE: 95 mg/dL (ref 65–99)
HEMATOCRIT: 43 % (ref 39.0–52.0)
HEMOGLOBIN: 14.6 g/dL (ref 13.0–17.0)
POTASSIUM: 3.8 mmol/L (ref 3.5–5.1)
Sodium: 140 mmol/L (ref 135–145)
TCO2: 24 mmol/L (ref 0–100)

## 2016-07-02 LAB — CBC
HEMATOCRIT: 42.7 % (ref 39.0–52.0)
Hemoglobin: 15 g/dL (ref 13.0–17.0)
MCH: 30.5 pg (ref 26.0–34.0)
MCHC: 35.1 g/dL (ref 30.0–36.0)
MCV: 87 fL (ref 78.0–100.0)
Platelets: 231 10*3/uL (ref 150–400)
RBC: 4.91 MIL/uL (ref 4.22–5.81)
RDW: 12.9 % (ref 11.5–15.5)
WBC: 8.2 10*3/uL (ref 4.0–10.5)

## 2016-07-02 LAB — URINALYSIS, ROUTINE W REFLEX MICROSCOPIC
BILIRUBIN URINE: NEGATIVE
GLUCOSE, UA: NEGATIVE mg/dL
HGB URINE DIPSTICK: NEGATIVE
Ketones, ur: NEGATIVE mg/dL
Leukocytes, UA: NEGATIVE
Nitrite: NEGATIVE
PROTEIN: NEGATIVE mg/dL
Specific Gravity, Urine: 1.006 (ref 1.005–1.030)
pH: 6 (ref 5.0–8.0)

## 2016-07-02 LAB — COMPREHENSIVE METABOLIC PANEL
ALBUMIN: 4.5 g/dL (ref 3.5–5.0)
ALT: 29 U/L (ref 17–63)
AST: 25 U/L (ref 15–41)
Alkaline Phosphatase: 63 U/L (ref 38–126)
Anion gap: 9 (ref 5–15)
BUN: 21 mg/dL — AB (ref 6–20)
CHLORIDE: 106 mmol/L (ref 101–111)
CO2: 25 mmol/L (ref 22–32)
CREATININE: 1.05 mg/dL (ref 0.61–1.24)
Calcium: 9.1 mg/dL (ref 8.9–10.3)
GFR calc Af Amer: >60 mL/min (ref >60)
GLUCOSE: 96 mg/dL (ref 65–99)
POTASSIUM: 3.8 mmol/L (ref 3.5–5.1)
SODIUM: 140 mmol/L (ref 135–145)
Total Bilirubin: 1 mg/dL (ref 0.3–1.2)
Total Protein: 6.9 g/dL (ref 6.5–8.1)

## 2016-07-02 LAB — DIFFERENTIAL
BASOS ABS: 0 10*3/uL (ref 0.0–0.1)
BASOS PCT: 1 %
EOS ABS: 0.9 10*3/uL — AB (ref 0.0–0.7)
Eosinophils Relative: 11 %
Lymphocytes Relative: 19 %
Lymphs Abs: 1.5 10*3/uL (ref 0.7–4.0)
MONOS PCT: 9 %
Monocytes Absolute: 0.7 10*3/uL (ref 0.1–1.0)
NEUTROS ABS: 5 10*3/uL (ref 1.7–7.7)
NEUTROS PCT: 60 %

## 2016-07-02 LAB — RAPID URINE DRUG SCREEN, HOSP PERFORMED
Amphetamines: NOT DETECTED
BARBITURATES: NOT DETECTED
BENZODIAZEPINES: NOT DETECTED
COCAINE: NOT DETECTED
Opiates: NOT DETECTED
TETRAHYDROCANNABINOL: NOT DETECTED

## 2016-07-02 LAB — PROTIME-INR
INR: 0.99
Prothrombin Time: 13.1 seconds (ref 11.4–15.2)

## 2016-07-02 LAB — I-STAT TROPONIN, ED: TROPONIN I, POC: 0 ng/mL (ref 0.00–0.08)

## 2016-07-02 LAB — ETHANOL

## 2016-07-02 LAB — APTT: aPTT: 31 seconds (ref 24–36)

## 2016-07-02 LAB — CBG MONITORING, ED: Glucose-Capillary: 83 mg/dL (ref 65–99)

## 2016-07-02 MED ORDER — LORAZEPAM 0.5 MG PO TABS: 0.5000 mg | ORAL_TABLET | Freq: Once | ORAL | Status: DC | PRN

## 2016-07-02 NOTE — ED Triage Notes (Addendum)
Wife reports leaving pt at home around 3 for a few hours and when returned (around 6) pt was very confused. Not remembering what he was doing. Pt ambulatory.

## 2016-07-02 NOTE — Consult Note (Addendum)
Neurology Consultation Reason for Consult: Memory Loss Referring Physician: Calla Kicks  CC: Memory Loss  History is obtained from: Patient, wife  HPI: Curtis [name] is a 71 y.o. male with a history of prostate cancer who presents with memory loss that started earlier today. His wife states that he was normal at 3 PM when she left to go run some errands, and on her return she found him to be confused. She states that he appears to have no memory of the past few days, and is not making new memories currently.  The patient states that he has no idea how he got to the hospital, but does seem to understand that he is having memory difficulty. He asks the same questions over and over again, but otherwise seems relatively clear.  Of note, he states that he feels like something is happened like this before.  LKW: 3 PM tpa given?: no, not a stroke Code stroke cancelled at the time of my initial conversation with ER physician, 7:03 pm  ROS: A 14 point ROS was performed and is negative except as noted in the HPI.   Past Medical History:  Diagnosis Date  . Asthma   . COPD (chronic obstructive pulmonary disease) ([place])   . Prostate cancer ([place])   . S/P radiation therapy 08/12/2014 through 09/25/2014     Prostate bed 6600 cGy in 33 sessions   . Shortness of breath    with exertion     Family History  Problem Relation Age of Onset  . Cancer Father     prostate     Social History:  reports that he quit smoking about 23 years ago. His smoking use included Cigarettes. He has a 2.50 pack-year smoking history. He does not have any smokeless tobacco history on file. He reports that he drinks alcohol. He reports that he does not use drugs.   Exam: Current vital signs: BP 144/90 (BP Location: Left Arm)   Pulse 64   Temp 98.2 F (36.8 C) (Oral)   Resp 17   Ht 5\' 9"  (1.753 m)   Wt 88.5 kg (195 lb)   SpO2 100%   BMI  28.80 kg/m  Vital signs in last 24 hours: Temp:  [98.2 F (36.8 C)] 98.2 F (36.8 C) (02/18 1839) Pulse Rate:  [64-79] 64 (02/18 1948) Resp:  [17-18] 17 (02/18 1948) BP: (144-174)/(90-93) 144/90 (02/18 1948) SpO2:  [99 %-100 %] 100 % (02/18 1948) Weight:  [88.5 kg (195 lb)] 88.5 kg (195 lb) (02/18 1913)   Physical Exam  Constitutional: Appears well-developed and well-nourished.  Psych: Affect appropriate to situation Eyes: No scleral injection HENT: No OP obstrucion Head: Normocephalic.  Cardiovascular: Normal rate and regular rhythm.  Respiratory: Effort normal and breath sounds normal to anterior ascultation GI: Soft.  No distension. There is no tenderness.  Skin: WDI  Neuro: Mental Status: Patient is awake, alert, oriented to person, he has difficulty with the month, but then guesses February, he also states that "it looks like [place]" when I ask him where he is. No signs of aphasia or neglect He is able to perform serial sevens, but when I perform memory he does not remember me asking him to remember items. Cranial Nerves: II: Visual Fields are full. Pupils are equal, round, and reactive to light.   III,IV, VI: EOMI without ptosis or diploplia.  V: Facial sensation is symmetric to temperature VII: Facial movement is symmetric.  VIII: hearing is intact to voice X: Uvula  elevates symmetrically XI: Shoulder shrug is symmetric. XII: tongue is midline without atrophy or fasciculations.  Motor: Tone is normal. Bulk is normal. 5/5 strength was present in all four extremities.  Sensory: Sensation is symmetric to light touch and temperature in the arms and legs. Cerebellar: FNF  intact bilaterally  I have reviewed labs in epic and the results pertinent to this consultation are: Chem 8-unremarkable  I have reviewed the images obtained: CT head-negative  Impression: 70 year old male with a history of similar episode who presents with severe anterograde amnesia. His  loss of the past few days is not uncommon in transient global amnesia and this is very characteristic of that presentation.  Currently, with his severe anterograde amnesia, I do not think he would be safe to go home and I do think that further evaluation with MRI and EEG would be reasonable.  Recommendations: 1) MRI brain without contrast 2) EEG 3) if the patient returns to baseline by tomorrow, he would not need to stay in the hospital for an EEG but this could not be done tomorrow. If these tests are done and are normal, no further neurological follow-up as needed.   Roland Rack, MD [facility] [phone]  If 7pm- 7am, please page neurology on call as listed in [place].

## 2016-07-02 NOTE — ED Notes (Signed)
Patient transported to CT 

## 2016-07-02 NOTE — ED Notes (Signed)
Patient continues to talk non-stop, repeating same statements over and over. States, "I am used to the hospital procedures, I had lots of asthmatic spells as a child and I have COPD." Will wait a few minutes and repeat same sentence. When redirecting patient and explaining he just verbalized that statement he states "oh, I don't remember telling you sorry." He will then proceed to repeating statement again. He is able to correctly verify name, dob, place, and time. He has been observed having conversations with wife and when asking the wife if what he is discussing is accurate she states "yes, except he keeps talking about the same stuff." Other than memory concerns he has good ROM and negative for neuro deficits otherwise.

## 2016-07-02 NOTE — H&P (Signed)
History and Physical  Patient Name: Curtis [name]     [id number]    DOB: 06/07/1945    DOA: 07/02/2016 PCP: Gennette Pac, MD   Patient coming from: Home  Chief Complaint: Confusion, disorientation  HPI: Curtis [name] is a 71 y.o. male with a past medical history significant for COPD/asthma, recurrent prostate cancer who presents with acute disorientation.  The patient has been under a lot of stress this past week because of St. 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  Other than the stress, he has been in his normal state of health until this afternoon, he was watching the [place], and his wife went out to run some errands for the business, and when she returned he was complaining of feeling "disoriented". She watched him for a little while, and realized that he was in fact quite confused, could not remember things that just happened around him, and so she brought him to the emergency room. He had no speech disturbance, no focal weakness, no face asymmetry, no difficulty swallowing, no paresthesias, no loss of consciousness, no episodes of unresponsiveness, no emotional lability.  ED course: -Afebrile, heart rate 79, respirations and pulse is normal, blood pressure 174/93 -Na 140, K 3.8, Cr 1.05 (baseline 0.9), WBC 8.2K, Hgb 15 -Alcohol level negative -Coags normal -Troponin negative -Urine drug screen negative -Urinalysis without pyuria or hematuria -CT head and a ICP -ECG unremarkable -He was initially called a code stroke, which was canceled by neurology over the phone, who suspected TGA -He was evaluated by Neurology, seemed to have improving isolated anterograde amnesia, and was recommended OBS overnight for suspected TGA   The patient denies any illicit drug use.  Denies respiratory symptoms.  The patient describes having episodes in the past, which he associates with his grandfathers bipolar disorder, in which the  patient has had "psychotic episodes", after long periods of sleep deprivation. By psychosis, he however does not mean disorganized thoughts, hallucinations, delusions, but rather very short temper and aggression, which are brief.        ROS: Review of Systems  Constitutional: Negative for chills and fever.  Respiratory: Negative for cough, sputum production, shortness of breath and wheezing.   Genitourinary: Negative for dysuria, frequency, hematuria and urgency.  Neurological: Negative for dizziness, tingling, tremors, sensory change, speech change, focal weakness, seizures, loss of consciousness, weakness and headaches.  All other systems reviewed and are negative.         Past Medical History:  Diagnosis Date  . Asthma   . COPD (chronic obstructive pulmonary disease) ([place])   . Prostate cancer ([place])   . S/P radiation therapy 08/12/2014 through 09/25/2014     Prostate bed 6600 cGy in 33 sessions   . Shortness of breath    with exertion    Past Surgical History:  Procedure Laterality Date  . 2008 right achilles tendon surgery    . CYSTOSCOPY  03/06/2012   Procedure: CYSTOSCOPY FLEXIBLE;  Surgeon: Bernestine Amass, MD;  Location: [facility];  Service: Urology;  Laterality: N/A;  with urethral dialation  . ROBOT ASSISTED LAPAROSCOPIC RADICAL PROSTATECTOMY  03/06/2012   Procedure: ROBOTIC ASSISTED LAPAROSCOPIC RADICAL PROSTATECTOMY;  Surgeon: Bernestine Amass, MD;  Location: [facility];  Service: Urology;  Laterality: N/A;  WITH BILATERAL PELVIC LYMPH NODE DISSECTION  . surgery for urethral stricture in the early 1990's at [facility]    . TONSILECTOMY, ADENOIDECTOMY, [place]  Social History: Patient lives with his wfie.  The patient walks unassisted.  He runs [employer] for [employer].  He is a former smoker.    Allergies  Allergen Reactions  . Penicillins Other (See Comments)    "Negative feeling" Has  patient had a PCN reaction causing immediate rash, facial/tongue/throat swelling, SOB or lightheadedness with hypotension: No Has patient had a PCN reaction causing severe rash involving mucus membranes or skin necrosis: No Has patient had a PCN reaction that required hospitalization: No Has patient had a PCN reaction occurring within the last 10 years: No If all of the above answers are "NO", then may proceed with Cephalosporin use.    Family history: family history includes Bipolar disorder in his paternal grandfather; Cancer in his father.  Prior to Admission medications   Medication Sig Start Date End Date Taking? Authorizing Provider  acetaminophen (TYLENOL) 325 MG tablet Take 650 mg by mouth every 6 (six) hours as needed for mild pain.    Yes Historical Provider, MD  albuterol (PROVENTIL HFA;VENTOLIN HFA) 108 (90 Base) MCG/ACT inhaler Inhale 2 puffs into the lungs every 6 (six) hours as needed. For shortness of breath Patient taking differently: Inhale 2 puffs into the lungs every 6 (six) hours as needed for shortness of breath. For shortness of breath 06/02/15  Yes Juanito Doom, MD  aspirin 325 MG tablet Take 325 mg by mouth daily.   Yes Historical Provider, MD  ibuprofen (ADVIL,MOTRIN) 200 MG tablet Take 200 mg by mouth every 6 (six) hours as needed for headache or mild pain.    Yes Historical Provider, MD  Multiple Vitamin (MULTIVITAMIN WITH MINERALS) TABS tablet Take 1 tablet by mouth daily.   Yes Historical Provider, MD  Tiotropium Bromide-Olodaterol (STIOLTO RESPIMAT) 2.5-2.5 MCG/ACT AERS Inhale 2 puffs into the lungs daily. 03/29/16  Yes Juanito Doom, MD       Physical Exam: BP 162/92 (BP Location: Right Arm)   Pulse 73   Temp 98.5 F (36.9 C)   Resp 17   Ht 5\' 9"  (1.753 m)   Wt 88.5 kg (195 lb)   SpO2 96%   BMI 28.80 kg/m  General appearance: Well-developed, adult male, alert and in no acute distress.   Eyes: Anicteric, conjunctiva injected, lids and lashes  normal. PERRL.    ENT: No nasal deformity, discharge, epistaxis.  Hearing normal. OP moist without lesions.   Neck: No neck masses.  Trachea midline.  No thyromegaly/tenderness. Lymph: No cervical or supraclavicular lymphadenopathy. Skin: Warm and dry.  No jaundice.  No suspicious rashes or lesions. Cardiac: RRR, nl S1-S2, no murmurs appreciated.  Capillary refill is brisk.  JVP normal.  No LE edema.  Radial and DP pulses 2+ and symmetric.  No carotid bruits. Respiratory: Normal respiratory rate and rhythm.  Expiratory wheezes, coarse breath sounsd throughout, nothing focal. Abdomen: Abdomen soft.  No TTP. No ascites, distension, hepatosplenomegaly.   MSK: No deformities or effusions.  No cyanosis or clubbing. Neuro: Pupils are 3 mm and reactive to 2 mm.  Extraocular movements are intact, without nystagmus.   Cranial nerve 5 is within normal limits.  Cranial nerve 7 is symmetrical.  Cranial nerve 8 is within normal limits.  Cranial nerves 9 and 10 reveal equal palate elevation.  Cranial nerve 11 reveals sternocleidomastoid strong.  Cranial nerve 12 is midline.  Motor strength testing is 5/5 in the upper and lower extremities bilaterally with normal motor, tone and bulk. Sensory examination is intact to light touch. The patient  is oriented to time (takes a second), place and person.  Speech is fluent.  Naming is grossly intact.  Recent recall seems impaied, but he is now able to remember where he is and respond promptly, he can remember things I said earlier in the conversation.  Wife concurs he seems to be retaining his surroundings better.  Remote recall, as well as general fund of knowledge seem within normal limits.  Attention span and concentration are within normal limits. Psych: Sensorium intact and responding to questions, attention normal.  Behavior appropriate.  Affect pleasant.  Judgment and insight appear normal.     Labs on Admission:  I have personally reviewed following labs and imaging  studies: CBC:  Recent Labs Lab 07/02/16 1853 07/02/16 1904  WBC 8.2  --   NEUTROABS 5.0  --   HGB 15.0 14.6  HCT 42.7 43.0  MCV 87.0  --   PLT 231  --    Basic Metabolic Panel:  Recent Labs Lab 07/02/16 1853 07/02/16 1904  NA 140 140  K 3.8 3.8  CL 106 104  CO2 25  --   GLUCOSE 96 95  BUN 21* 21*  CREATININE 1.05 1.10  CALCIUM 9.1  --    GFR: Estimated Creatinine Clearance: 69.7 mL/min (by C-G formula based on SCr of 1.1 mg/dL).  Liver Function Tests:  Recent Labs Lab 07/02/16 1853  AST 25  ALT 29  ALKPHOS 63  BILITOT 1.0  PROT 6.9  ALBUMIN 4.5   No results for input(s): LIPASE, AMYLASE in the last 168 hours. No results for input(s): AMMONIA in the last 168 hours. Coagulation Profile:  Recent Labs Lab 07/02/16 1853  INR 0.99   Cardiac Enzymes: No results for input(s): CKTOTAL, CKMB, CKMBINDEX, TROPONINI in the last 168 hours. BNP (last 3 results) No results for input(s): PROBNP in the last 8760 hours. HbA1C: No results for input(s): HGBA1C in the last 72 hours. CBG:  Recent Labs Lab 07/02/16 1845  GLUCAP 83   Lipid Profile: No results for input(s): CHOL, HDL, LDLCALC, TRIG, CHOLHDL, LDLDIRECT in the last 72 hours. Thyroid Function Tests: No results for input(s): TSH, T4TOTAL, FREET4, T3FREE, THYROIDAB in the last 72 hours. Anemia Panel: No results for input(s): VITAMINB12, FOLATE, FERRITIN, TIBC, IRON, RETICCTPCT in the last 72 hours. Sepsis Labs: Invalid input(s): PROCALCITONIN, LACTICIDVEN No results found for this or any previous visit (from the past 240 hour(s)).       Radiological Exams on Admission: Personally reviewed CT head report: Ct Head Wo Contrast  Result Date: 07/02/2016 CLINICAL DATA:  Confusion. EXAM: CT HEAD WITHOUT CONTRAST TECHNIQUE: Contiguous axial images were obtained from the base of the skull through the vertex without intravenous contrast. COMPARISON:  None. FINDINGS: Brain: There is no evidence for acute  hemorrhage, hydrocephalus, mass lesion, or abnormal extra-axial fluid collection. No definite CT evidence for acute infarction. Vascular: No hyperdense vessel or unexpected calcification. Skull: No evidence for fracture. No worrisome lytic or sclerotic lesion. Sinuses/Orbits: The visualized paranasal sinuses and mastoid air cells are clear. Visualized portions of the globes and intraorbital fat are unremarkable. Other: None. IMPRESSION: No acute intracranial abnormality. Electronically Signed   By: Misty Stanley M.D.   On: 07/02/2016 19:09    EKG: Independently reviewed. Rate 74, QTc 445, no ST changes.        Assessment/Plan Principal Problem:   Anterograde amnesia Active Problems:   COPD with asthma ([place])   Malignant neoplasm of prostate ([place])   Transient global amnesia  1. Aneterograde amnesia, suspect transient global amnesia:    -MRI brain ordered -EEG ordered (per Neurology, if patient back to baseline tomorrow AM, this EEG could be done as outpaitent with Neuro follow up)  2. COPD:  No active -Continue Stiolto -Albuterol PRN  3. Hypertension:  No previous diagnosis. -Monitor overnight  4. Prostate Cancer:  In radiation treatment here at [facility]       DVT prophylaxis: Lovenox  Code Status: FULL  Family Communication: Wife at bedside  Disposition Plan: Anticipate EEG and MRI and clinical monitoring until resolved amnesia. Consults called: Neuro Admission status: OBS At the point of initial evaluation, it is my clinical opinion that admission for OBSERVATION is reasonable and necessary because the patient's presenting complaints in the context of their chronic conditions represent sufficient risk of deterioration or significant morbidity to constitute reasonable grounds for close observation in the hospital setting, but that the patient may be medically stable for discharge from the hospital within 24 to 48 hours.    Medical decision making: Patient seen at 11:26 PM  on 07/02/2016.  The patient was discussed with Dr. Johnney Killian.  What exists of the patient's chart was reviewed in depth and summarized above.  Clinical condition: stable.        Edwin Dada [facility] Pager [phone]

## 2016-07-03 ENCOUNTER — Observation Stay (HOSPITAL_COMMUNITY): Payer: Medicare Other

## 2016-07-03 ENCOUNTER — Observation Stay (HOSPITAL_BASED_OUTPATIENT_CLINIC_OR_DEPARTMENT_OTHER)
Admit: 2016-07-03 | Discharge: 2016-07-03 | Disposition: A | Discharge status: Home / Self Care | Payer: Medicare Other | Attending: Family Medicine | Admitting: Family Medicine

## 2016-07-03 DIAGNOSIS — R411 Anterograde amnesia: Secondary | ICD-10-CM | POA: Diagnosis not present

## 2016-07-03 DIAGNOSIS — R410 Disorientation, unspecified: Secondary | ICD-10-CM

## 2016-07-03 DIAGNOSIS — J449 Chronic obstructive pulmonary disease, unspecified: Secondary | ICD-10-CM | POA: Diagnosis not present

## 2016-07-03 DIAGNOSIS — C61 Malignant neoplasm of prostate: Secondary | ICD-10-CM | POA: Diagnosis not present

## 2016-07-03 DIAGNOSIS — G454 Transient global amnesia: Secondary | ICD-10-CM | POA: Diagnosis not present

## 2016-07-03 LAB — PHOSPHORUS: PHOSPHORUS: 3.3 mg/dL (ref 2.5–4.6)

## 2016-07-03 LAB — CBC WITH DIFFERENTIAL/PLATELET
Basophils Absolute: 0 10*3/uL (ref 0.0–0.1)
Basophils Relative: 1 %
EOS ABS: 1 10*3/uL — AB (ref 0.0–0.7)
EOS PCT: 14 %
HCT: 41.8 % (ref 39.0–52.0)
Hemoglobin: 14.5 g/dL (ref 13.0–17.0)
LYMPHS ABS: 1.1 10*3/uL (ref 0.7–4.0)
Lymphocytes Relative: 16 %
MCH: 29.7 pg (ref 26.0–34.0)
MCHC: 34.7 g/dL (ref 30.0–36.0)
MCV: 85.7 fL (ref 78.0–100.0)
MONO ABS: 0.5 10*3/uL (ref 0.1–1.0)
MONOS PCT: 7 %
Neutro Abs: 4.1 10*3/uL (ref 1.7–7.7)
Neutrophils Relative %: 62 %
PLATELETS: 218 10*3/uL (ref 150–400)
RBC: 4.88 MIL/uL (ref 4.22–5.81)
RDW: 12.8 % (ref 11.5–15.5)
WBC: 6.6 10*3/uL (ref 4.0–10.5)

## 2016-07-03 LAB — COMPREHENSIVE METABOLIC PANEL
ALK PHOS: 58 U/L (ref 38–126)
ALT: 27 U/L (ref 17–63)
ANION GAP: 8 (ref 5–15)
AST: 26 U/L (ref 15–41)
Albumin: 4.1 g/dL (ref 3.5–5.0)
BUN: 18 mg/dL (ref 6–20)
CALCIUM: 9.2 mg/dL (ref 8.9–10.3)
CHLORIDE: 105 mmol/L (ref 101–111)
CO2: 26 mmol/L (ref 22–32)
Creatinine, Ser: 1.19 mg/dL (ref 0.61–1.24)
GFR calc non Af Amer: >60 mL/min (ref >60)
Glucose, Bld: 136 mg/dL — ABNORMAL HIGH (ref 65–99)
Potassium: 3.8 mmol/L (ref 3.5–5.1)
SODIUM: 139 mmol/L (ref 135–145)
Total Bilirubin: 0.9 mg/dL (ref 0.3–1.2)
Total Protein: 6.7 g/dL (ref 6.5–8.1)

## 2016-07-03 LAB — MAGNESIUM: MAGNESIUM: 2 mg/dL (ref 1.7–2.4)

## 2016-07-03 MED ORDER — ALBUTEROL SULFATE (2.5 MG/3ML) 0.083% IN NEBU: 2.5000 mg | INHALATION_SOLUTION | RESPIRATORY_TRACT | Status: DC | PRN

## 2016-07-03 MED ORDER — ASPIRIN EC 325 MG PO TBEC
325.0000 mg | DELAYED_RELEASE_TABLET | Freq: Every day | ORAL | Status: DC
  Administered 2016-07-03: 325 mg via ORAL
  Filled 2016-07-03: qty 1

## 2016-07-03 MED ORDER — UMECLIDINIUM-VILANTEROL 62.5-25 MCG/INH IN AEPB: 1.0000 | INHALATION_SPRAY | Freq: Every day | RESPIRATORY_TRACT | Status: DC

## 2016-07-03 MED ORDER — ENOXAPARIN SODIUM 40 MG/0.4ML ~~LOC~~ SOLN
40.0000 mg | Freq: Every day | SUBCUTANEOUS | Status: DC
  Filled 2016-07-03: qty 0.4

## 2016-07-03 MED ORDER — TIOTROPIUM BROMIDE-OLODATEROL 2.5-2.5 MCG/ACT IN AERS: 2.0000 | INHALATION_SPRAY | Freq: Every day | RESPIRATORY_TRACT | Status: DC

## 2016-07-03 MED ORDER — UMECLIDINIUM-VILANTEROL 62.5-25 MCG/INH IN AEPB
1.0000 | INHALATION_SPRAY | Freq: Every day | RESPIRATORY_TRACT | Status: DC
  Filled 2016-07-03: qty 14

## 2016-07-03 MED ORDER — NON FORMULARY: 2.0000 | Freq: Every day | Status: DC

## 2016-07-03 MED ORDER — ACETAMINOPHEN 650 MG RE SUPP: 650.0000 mg | Freq: Four times a day (QID) | RECTAL | Status: DC | PRN

## 2016-07-03 MED ORDER — ACETAMINOPHEN 325 MG PO TABS: 650.0000 mg | ORAL_TABLET | Freq: Four times a day (QID) | ORAL | Status: DC | PRN

## 2016-07-03 NOTE — Discharge Summary (Signed)
Physician Discharge Summary  MANSFIELD COSGROVE [id number] DOB: 04-02-1946 DOA: 07/02/2016  PCP: Gennette Pac, MD  Admit date: 07/02/2016 Discharge date: 07/03/2016  Admitted From: Home Disposition:  Home  Recommendations for Outpatient Follow-up:  1. Follow up with PCP Dr. Rex Kras in 1-2 weeks 2. Follow up with Neurology at [facility] in 1-2 weeks 3. Follow up with Pulmonology as an outpatient at next scheduled appointment 4. Please obtain BMP/CBC in one week  Home Health: No Equipment/Devices: None  Discharge Condition: Stable CODE STATUS: FULL CODE Diet recommendation: Heart Healthy   Brief/Interim Summary: Curtis [name] is a 71 y.o. male with a past medical history significant for COPD/asthma, recurrent prostate cancer who presented with acute disorientation. The patient has been under a lot of stress this past week because of St. 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  Other than the stress, he has been in his normal state of health until this afternoon, he was watching the [place], and his wife went out to run some errands for the business, and when she returned he was complaining of feeling "disoriented". She watched him for a little while, and realized that he was in fact quite confused, could not remember things that just happened around him, and so she brought him to the emergency room. He had no speech disturbance, no focal weakness, no face asymmetry, no difficulty swallowing, no paresthesias, no loss of consciousness, no episodes of unresponsiveness, no emotional lability. Worked up and Neurology consulted and suspected severe anterograde amnesia and had MRI and EEG which were negative. Patient's symptoms improved and was back to baseline this Am and was deemed medically stable to be D/C'd home and follow up with PCP and with Neurology as an outpatient.   Discharge Diagnoses:  Principal Problem:   Anterograde amnesia Active  Problems:   COPD with asthma ([place])   Malignant neoplasm of prostate ([place])   Transient global amnesia  1. Severe Anterograde Amnesia, suspect transient global amnesia:    -Neurology Dr. Leonel Ramsay consulted and appreciated Recc's -Head CT showed no acute intracranial abnormality -MRI brain ordered and showed no acute finding and unremarkable medial temporal lobes -EEG ordered and completed. Read by Dr. Tomi Likens as a normal awake EEG -Symptoms improved and resolved -Follow up with Neurology as an outpatient  2. COPD:  -Stable and No active Issues -Continue Stiolto at home -Albuterol PRN -MRI showed nasal cavity polyps and generalized sinusitis -Follow up with PCP Dr. Lake Bells as an outpatient  3. Prostate Cancer:  -S/p robotic assisted laprascopic radical prostectomy and radiation treatment here at [facility] with Dr. Arloa Koh in [place] and Dr. Risa Grill in Urology did surgery   4. Mild Hyperglycemia -Likely reactive -Follow up with PCP  Discharge Instructions  Discharge Instructions    Call MD for:  difficulty breathing, headache or visual disturbances    Complete by:  As directed    Call MD for:  extreme fatigue    Complete by:  As directed    Call MD for:  persistant dizziness or light-headedness    Complete by:  As directed    Call MD for:  persistant nausea and vomiting    Complete by:  As directed    Call MD for:  severe uncontrolled pain    Complete by:  As directed    Call MD for:  temperature >100.4    Complete by:  As directed    Diet - low sodium heart healthy  Complete by:  As directed    Discharge instructions    Complete by:  As directed    Follow up with PCP and Neurology as an outpatient. Take all medications as prescribed. If symptoms change or worsen please go to PCP or ER for evaluation.   Increase activity slowly    Complete by:  As directed      Allergies as of 07/03/2016      Reactions   Penicillins Other (See Comments)   "Negative  feeling" Has patient had a PCN reaction causing immediate rash, facial/tongue/throat swelling, SOB or lightheadedness with hypotension: No Has patient had a PCN reaction causing severe rash involving mucus membranes or skin necrosis: No Has patient had a PCN reaction that required hospitalization: No Has patient had a PCN reaction occurring within the last 10 years: No If all of the above answers are "NO", then may proceed with Cephalosporin use.      Medication List    STOP taking these medications   ibuprofen 200 MG tablet Commonly known as:  ADVIL,MOTRIN     TAKE these medications   acetaminophen 325 MG tablet Commonly known as:  TYLENOL Take 650 mg by mouth every 6 (six) hours as needed for mild pain.   albuterol 108 (90 Base) MCG/ACT inhaler Commonly known as:  PROVENTIL HFA;VENTOLIN HFA Inhale 2 puffs into the lungs every 6 (six) hours as needed. For shortness of breath What changed:  reasons to take this  additional instructions   aspirin 325 MG tablet Take 325 mg by mouth daily.   multivitamin with minerals Tabs tablet Take 1 tablet by mouth daily.   Tiotropium Bromide-Olodaterol 2.5-2.5 MCG/ACT Aers Commonly known as:  STIOLTO RESPIMAT Inhale 2 puffs into the lungs daily.      Follow-up Information    Gennette Pac, MD. Call in 1 week(s).   Specialty:  Family Medicine Why:  Call within 1 week  Contact information: [place] [address] [phone]        [facility]. Call in 1 week(s).   Specialty:  Neurology Why:  Call to follow up for Amnesia in 1 week Contact information: [place] [phone]         Allergies  Allergen Reactions  . Penicillins Other (See Comments)    "Negative feeling" Has patient had a PCN reaction causing immediate rash, facial/tongue/throat swelling, SOB or lightheadedness with hypotension: No Has patient had a PCN  reaction causing severe rash involving mucus membranes or skin necrosis: No Has patient had a PCN reaction that required hospitalization: No Has patient had a PCN reaction occurring within the last 10 years: No If all of the above answers are "NO", then may proceed with Cephalosporin use.   Consultations:  Neurology  Procedures/Studies: Ct Head Wo Contrast  Result Date: 07/02/2016 CLINICAL DATA:  Confusion. EXAM: CT HEAD WITHOUT CONTRAST TECHNIQUE: Contiguous axial images were obtained from the base of the skull through the vertex without intravenous contrast. COMPARISON:  None. FINDINGS: Brain: There is no evidence for acute hemorrhage, hydrocephalus, mass lesion, or abnormal extra-axial fluid collection. No definite CT evidence for acute infarction. Vascular: No hyperdense vessel or unexpected calcification. Skull: No evidence for fracture. No worrisome lytic or sclerotic lesion. Sinuses/Orbits: The visualized paranasal sinuses and mastoid air cells are clear. Visualized portions of the globes and intraorbital fat are unremarkable. Other: None. IMPRESSION: No acute intracranial abnormality. Electronically Signed   By: Verda Cumins.D.  On: 07/02/2016 19:09   Mr Brain Wo Contrast  Result Date: 07/03/2016 CLINICAL DATA:  Transient global amnesia. EXAM: MRI HEAD WITHOUT CONTRAST TECHNIQUE: Multiplanar, multiecho pulse sequences of the brain and surrounding structures were obtained without intravenous contrast. COMPARISON:  CT yesterday FINDINGS: Brain: No diffusion abnormality, swelling or mass in the medial temporal lobes to correlate with the history. No acute infarction, hemorrhage, hydrocephalus, extra-axial collection or mass lesion. Mild periventricular white matter FLAIR hyperintensity. Vascular: Normal flow voids. Skull and upper cervical spine: No marrow lesion noted. Sinuses/Orbits: Bilateral posterior nasal cavity polypoid structures, larger on the right where there is also retained  secretions. Mild mucosal thickening with fluid in the paranasal sinuses. IMPRESSION: 1. No acute finding.  Unremarkable medial temporal lobes. 2. Nasal cavity polyps and generalized sinusitis. Electronically Signed   By: Monte Fantasia M.D.   On: 07/03/2016 15:26     Subjective: Seen and eaxmined and doing much better. No N/V and wife states he is back to baseline and patient is remembering things from yesterday. No other concerns or complaints at this time.   Discharge Exam: Vitals:   07/03/16 0514 07/03/16 1549  BP: 125/80 106/70  Pulse: 73 70  Resp: 16 18  Temp: 98.1 F (36.7 C) 97.9 F (36.6 C)   Vitals:   07/03/16 0133 07/03/16 0153 07/03/16 0514 07/03/16 1549  BP: 147/77 129/79 125/80 106/70  Pulse: 75 72 73 70  Resp: 16 18 16 18   Temp:  97.7 F (36.5 C) 98.1 F (36.7 C) 97.9 F (36.6 C)  TempSrc:  Oral Oral Oral  SpO2: 95% 97% 97% 97%  Weight:      Height:       General: Pt is alert, awake, not in acute distress Cardiovascular: RRR, S1/S2 +, no rubs, no gallops Respiratory: Diminished bilaterally, no wheezing, no rhonchi; Patient not tachypenic or using accessory muscles to breathe.  Abdominal: Soft, NT, ND, bowel sounds + Extremities: no edema, no cyanosis  The results of significant diagnostics from this hospitalization (including imaging, microbiology, ancillary and laboratory) are listed below for reference.    Microbiology: No results found for this or any previous visit (from the past 240 hour(s)).   Labs: BNP (last 3 results) No results for input(s): BNP in the last 8760 hours. Basic Metabolic Panel:  Recent Labs Lab 07/02/16 1853 07/02/16 1904 07/03/16 0849  NA 140 140 139  K 3.8 3.8 3.8  CL 106 104 105  CO2 25  --  26  GLUCOSE 96 95 136*  BUN 21* 21* 18  CREATININE 1.05 1.10 1.19  CALCIUM 9.1  --  9.2  MG  --   --  2.0  PHOS  --   --  3.3   Liver Function Tests:  Recent Labs Lab 07/02/16 1853 07/03/16 0849  AST 25 26  ALT 29 27   ALKPHOS 63 58  BILITOT 1.0 0.9  PROT 6.9 6.7  ALBUMIN 4.5 4.1   No results for input(s): LIPASE, AMYLASE in the last 168 hours. No results for input(s): AMMONIA in the last 168 hours. CBC:  Recent Labs Lab 07/02/16 1853 07/02/16 1904 07/03/16 0849  WBC 8.2  --  6.6  NEUTROABS 5.0  --  4.1  HGB 15.0 14.6 14.5  HCT 42.7 43.0 41.8  MCV 87.0  --  85.7  PLT 231  --  218   Cardiac Enzymes: No results for input(s): CKTOTAL, CKMB, CKMBINDEX, TROPONINI in the last 168 hours. BNP: Invalid input(s): POCBNP CBG:  Recent Labs Lab 07/02/16 1845  GLUCAP 83   D-Dimer No results for input(s): DDIMER in the last 72 hours. Hgb A1c No results for input(s): HGBA1C in the last 72 hours. Lipid Profile No results for input(s): CHOL, HDL, LDLCALC, TRIG, CHOLHDL, LDLDIRECT in the last 72 hours. Thyroid function studies No results for input(s): TSH, T4TOTAL, T3FREE, THYROIDAB in the last 72 hours.  Invalid input(s): FREET3 Anemia work up No results for input(s): VITAMINB12, FOLATE, FERRITIN, TIBC, IRON, RETICCTPCT in the last 72 hours. Urinalysis    Component Value Date/Time   COLORURINE STRAW (A) 07/02/2016 1918   APPEARANCEUR CLEAR 07/02/2016 1918   LABSPEC 1.006 07/02/2016 1918   PHURINE 6.0 07/02/2016 1918   GLUCOSEU NEGATIVE 07/02/2016 1918   HGBUR NEGATIVE 07/02/2016 1918   BILIRUBINUR NEGATIVE 07/02/2016 1918   KETONESUR NEGATIVE 07/02/2016 1918   PROTEINUR NEGATIVE 07/02/2016 1918   NITRITE NEGATIVE 07/02/2016 1918   LEUKOCYTESUR NEGATIVE 07/02/2016 1918   Sepsis Labs Invalid input(s): PROCALCITONIN,  WBC,  LACTICIDVEN Microbiology No results found for this or any previous visit (from the past 240 hour(s)).  Time coordinating discharge: Over 30 minutes  SIGNED:  Kerney Elbe, DO [facility] 07/03/2016, 3:57 PM Pager [phone]  If 7PM-7AM, please contact night-coverage [email or web address] Password [email or web address]

## 2016-07-03 NOTE — Procedures (Signed)
ELECTROENCEPHALOGRAM REPORT  Date of Study: 07/03/2016  Patient's Name: Curtis [name] MRN: [id number] Date of Birth: 1945/06/08  Referring Provider: Suann Larry. Danford, MD  Clinical History: 71 year old male with history of COPD and recurrent prostate cancer who presents for acute disorientation.  Medications: acetaminophen (TYLENOL)  650 mg   albuterol (PROVENTIL) (2.5 MG/3ML) 0.083% nebulizer solution 2.5 mg  LORazepam (ATIVAN) tablet 0.5 mg  umeclidinium-vilanterol (ANORO ELLIPTA) 62.5-25 MCG/INH 1 puff  Technical Summary: A multichannel digital EEG recording measured by the international 10-20 system with electrodes applied with paste and impedances below 5000 ohms performed in our laboratory with EKG monitoring in an awake patient.  Hyperventilation was not performed.  Photic stimulation was performed.  The digital EEG was referentially recorded, reformatted, and digitally filtered in a variety of bipolar and referential montages for optimal display.    Description: The patient is awake during the recording.  During maximal wakefulness, there is a symmetric, low-voltage 11 Hz posterior dominant rhythm that attenuates with eye opening.  The record is symmetric.  Stage 2 sleep was not seen.  Photic stimulation did not elicit any abnormalities.  There were no epileptiform discharges or electrographic seizures seen.    EKG lead was unremarkable.  Impression: This awake EEG is normal.    Clinical Correlation: A normal EEG does not exclude a clinical diagnosis of epilepsy.  If further clinical questions remain, prolonged EEG may be helpful.  Clinical correlation is advised.   Metta Clines, DO

## 2016-07-03 NOTE — Progress Notes (Signed)
EEG completed; results pending.    

## 2016-07-03 NOTE — Progress Notes (Signed)
DC instructions reviewed with patient. Patient and wife verbalize understanding. IV removed, no prescriptions.

## 2016-07-09 NOTE — ED Provider Notes (Signed)
[facility] Provider Note   CSN: [id number] Arrival date & time: 07/02/16  1833     History   Chief Complaint Chief Complaint  Patient presents with  . Altered Mental Status    HPI Curtis [name] is a 71 y.o. male.  HPI Patient's wife reports that she was out of the house for a couple of hours. She was when she left her husband was normal. She had talked to him once in the interim and did not notice anything specifically changed. She got home, he seemed confused and couldn't remember she had gone in Asking her the same questions over and over again. The patient denies any focal area of weakness numbness tingling or dysfunction. Gait anomaly, slurred speech or focal weakness. He has never had similar episode. Past Medical History:  Diagnosis Date  . Asthma   . COPD (chronic obstructive pulmonary disease) ([place])   . Prostate cancer ([place])   . S/P radiation therapy 08/12/2014 through 09/25/2014     Prostate bed 6600 cGy in 33 sessions   . Shortness of breath    with exertion    Patient Active Problem List   Diagnosis Date Noted  . Anterograde amnesia 07/02/2016  . Transient global amnesia 07/02/2016  . Lung nodule 03/15/2015  . Allergic rhinitis 12/31/2013  . Other malaise and fatigue 12/31/2013  . Malignant neoplasm of prostate ([place]) 10/01/2013  . COPD with asthma ([place]) 12/30/2012    Past Surgical History:  Procedure Laterality Date  . 2008 right achilles tendon surgery    . CYSTOSCOPY  03/06/2012   Procedure: CYSTOSCOPY FLEXIBLE;  Surgeon: Bernestine Amass, MD;  Location: [facility];  Service: Urology;  Laterality: N/A;  with urethral dialation  . ROBOT ASSISTED LAPAROSCOPIC RADICAL PROSTATECTOMY  03/06/2012   Procedure: ROBOTIC ASSISTED LAPAROSCOPIC RADICAL PROSTATECTOMY;  Surgeon: Bernestine Amass, MD;  Location: [facility];  Service: Urology;  Laterality: N/A;  WITH BILATERAL PELVIC LYMPH NODE DISSECTION    . surgery for urethral stricture in the early 1990's at [facility]    . TONSILECTOMY, ADENOIDECTOMY, BILATERAL MYRINGOTOMY AND TUBES  1955       Home Medications    Prior to Admission medications   Medication Sig Start Date End Date Taking? Authorizing Provider  acetaminophen (TYLENOL) 325 MG tablet Take 650 mg by mouth every 6 (six) hours as needed for mild pain.    Yes Historical Provider, MD  albuterol (PROVENTIL HFA;VENTOLIN HFA) 108 (90 Base) MCG/ACT inhaler Inhale 2 puffs into the lungs every 6 (six) hours as needed. For shortness of breath Patient taking differently: Inhale 2 puffs into the lungs every 6 (six) hours as needed for shortness of breath. For shortness of breath 06/02/15  Yes Juanito Doom, MD  aspirin 325 MG tablet Take 325 mg by mouth daily.   Yes Historical Provider, MD  Multiple Vitamin (MULTIVITAMIN WITH MINERALS) TABS tablet Take 1 tablet by mouth daily.   Yes Historical Provider, MD  Tiotropium Bromide-Olodaterol (STIOLTO RESPIMAT) 2.5-2.5 MCG/ACT AERS Inhale 2 puffs into the lungs daily. 03/29/16  Yes Juanito Doom, MD    Family History Family History  Problem Relation Age of Onset  . Cancer Father     prostate  . Bipolar disorder Paternal Grandfather     Social History Social History  Substance Use Topics  . Smoking status: Former Smoker    Packs/day: 0.25    Years: 10.00    Types: Cigarettes    Quit date: 05/15/1993  . Smokeless tobacco:  Never Used  . Alcohol use Yes     Comment: 2 glasses wine at night     Allergies   Penicillins   Review of Systems Review of Systems 10 Systems reviewed and are negative for acute change except as noted in the HPI.   Physical Exam Updated Vital Signs BP 106/70 (BP Location: Left Arm)   Pulse 70   Temp 97.9 F (36.6 C) (Oral)   Resp 18   Ht 5\' 9"  (1.753 m)   Wt 195 lb (88.5 kg)   SpO2 97%   BMI 28.80 kg/m   Physical Exam  Constitutional: He appears well-developed and well-nourished.  HENT:   Head: Normocephalic and atraumatic.  Mouth/Throat: Oropharynx is clear and moist.  Eyes: Conjunctivae and EOM are normal. Pupils are equal, round, and reactive to light.  Neck: Neck supple.  Cardiovascular: Normal rate and regular rhythm.   No murmur heard. Pulmonary/Chest: Effort normal and breath sounds normal. No respiratory distress.  Abdominal: Soft. There is no tenderness.  Musculoskeletal: Normal range of motion. He exhibits no edema.  Neurological: He is alert. No cranial nerve deficit. He exhibits normal muscle tone. Coordination normal.  Patient is alert and speech is clear. Cognitive function is good but he does have short-term memory loss and repetitive questioning. Higher order thinking is intact. No motor deficit. Normal coordination.  Skin: Skin is warm and dry.  Psychiatric: He has a normal mood and affect.  Nursing note and vitals reviewed.    ED Treatments / Results  Labs (all labs ordered are listed, but only abnormal results are displayed) Labs Reviewed  DIFFERENTIAL - Abnormal; Notable for the following:       Result Value   Eosinophils Absolute 0.9 (*)    All other components within normal limits  COMPREHENSIVE METABOLIC PANEL - Abnormal; Notable for the following:    BUN 21 (*)    All other components within normal limits  URINALYSIS, ROUTINE W REFLEX MICROSCOPIC - Abnormal; Notable for the following:    Color, Urine STRAW (*)    All other components within normal limits  CBC WITH DIFFERENTIAL/PLATELET - Abnormal; Notable for the following:    Eosinophils Absolute 1.0 (*)    All other components within normal limits  COMPREHENSIVE METABOLIC PANEL - Abnormal; Notable for the following:    Glucose, Bld 136 (*)    All other components within normal limits  I-STAT CHEM 8, ED - Abnormal; Notable for the following:    BUN 21 (*)    Calcium, Ion 1.13 (*)    All other components within normal limits  ETHANOL  PROTIME-INR  APTT  CBC  RAPID URINE DRUG  SCREEN, HOSP PERFORMED  MAGNESIUM  PHOSPHORUS  CBG MONITORING, ED  I-STAT TROPOININ, ED    EKG  EKG Interpretation  Date/Time:  Sunday July 02 2016 18:45:43 EST Ventricular Rate:  74 PR Interval:    QRS Duration: 106 QT Interval:  401 QTC Calculation: 445 R Axis:   87 Text Interpretation:  Sinus rhythm Consider right ventricular hypertrophy agree. no STEMI Confirmed by Johnney Killian, MD, Jeannie Done [phone]) on 07/02/2016 10:40:54 PM       Radiology No results found.  Procedures Procedures (including critical care time)  Medications Ordered in ED Medications - No data to display   Initial Impression / Assessment and Plan / ED Course  I have reviewed the triage vital signs and the nursing notes.  Pertinent labs & imaging results that were available during my care of the  patient were reviewed by me and considered in my medical decision making (see chart for details).     Consult: Dr. Leonel Ramsay.  Final Clinical Impressions(s) / ED Diagnoses   Final diagnoses:  Transient global amnesia   Patient presents with short-term memory loss. Neurologically he is otherwise intact. Findings are suggestive of TGA. Patient is also been assessed by Dr. Leonel Ramsay. Will proceed with MRI and CVA rule out.  New Prescriptions Discharge Medication List as of 07/03/2016  4:09 PM       Charlesetta Shanks, MD 07/09/16 [phone]

## 2017-02-08 ENCOUNTER — Other Ambulatory Visit: Payer: Self-pay | Admitting: Family Medicine

## 2017-02-08 DIAGNOSIS — E041 Nontoxic single thyroid nodule: Secondary | ICD-10-CM

## 2017-04-11 ENCOUNTER — Other Ambulatory Visit: Payer: Self-pay | Admitting: Pulmonary Disease

## 2017-04-12 ENCOUNTER — Ambulatory Visit: Payer: Medicare Other | Admitting: Pulmonary Disease

## 2017-04-12 VITALS — BP 116/68 | HR 71 | Ht 68.0 in | Wt 195.6 lb

## 2017-04-12 DIAGNOSIS — J449 Chronic obstructive pulmonary disease, unspecified: Secondary | ICD-10-CM
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
## 2017-04-12 NOTE — Patient Instructions (Signed)
Chronic obstructive asthma: Continue Stiolto daily Use albuterol as needed for chest tightness wheezing or shortness of breath Stay active, exercise regularly Practice good hand hygiene this time a year Follow-up in 1 year or sooner if needed

## 2017-05-11 ENCOUNTER — Telehealth: Payer: Self-pay | Admitting: Pulmonary Disease

## 2017-05-11 ENCOUNTER — Other Ambulatory Visit: Payer: Self-pay | Admitting: Pulmonary Disease

## 2017-05-11 NOTE — Telephone Encounter (Signed)
Pt called and he is aware of refill that has been sent to the pharmacy.  Nothing further is needed.

## 2017-05-11 NOTE — Telephone Encounter (Signed)
Ashley sent Rx at the 11.28.18 office visit and Rx was sent again today [facility] and verified that it was received  LMOM TCB x1

## 2018-03-15 HISTORY — PX: CATARACT EXTRACTION: SUR2

## 2018-03-18 ENCOUNTER — Ambulatory Visit
Admission: RE | Admit: 2018-03-18 | Discharge: 2018-03-18 | Disposition: A | Discharge status: Home / Self Care | Payer: Medicare Other | Source: Ambulatory Visit | Attending: Family Medicine | Admitting: Family Medicine

## 2018-03-18 DIAGNOSIS — E041 Nontoxic single thyroid nodule: Secondary | ICD-10-CM

## 2018-05-13 ENCOUNTER — Telehealth: Payer: Self-pay | Admitting: Pulmonary Disease

## 2018-05-13 NOTE — Telephone Encounter (Signed)
Called and spoke with patient, he is aware of sample being placed up front. Nothing further needed.

## 2018-05-13 NOTE — Telephone Encounter (Signed)
Pt returning call CB# [phone]

## 2018-05-13 NOTE — Telephone Encounter (Signed)
Called patient, unable to reach. Left message to give [place] a call back. Placed sample up front.

## 2018-05-14 HISTORY — PX: CATARACT EXTRACTION: SUR2

## 2018-05-27 ENCOUNTER — Telehealth: Payer: Self-pay | Admitting: Pulmonary Disease

## 2018-05-27 MED ORDER — TIOTROPIUM BROMIDE-OLODATEROL 2.5-2.5 MCG/ACT IN AERS: 2.0000 | INHALATION_SPRAY | Freq: Every day | RESPIRATORY_TRACT | 0 refills | Status: DC

## 2018-05-27 NOTE — Telephone Encounter (Signed)
Called and spoke to pt, who is requesting sample of Stiolto to get him by until his Ov on 06/19/18. Pt states he would like to discuss medication at his office visit  One sample of Stiolto has been placed up front for pick up. Pt is aware and voiced his understanding. Nothing further is needed.

## 2018-06-19 ENCOUNTER — Encounter: Payer: Self-pay | Admitting: Pulmonary Disease

## 2018-06-19 ENCOUNTER — Ambulatory Visit: Payer: Medicare Other | Admitting: Pulmonary Disease

## 2018-06-19 VITALS — BP 116/74 | HR 71 | Ht 68.0 in | Wt 203.6 lb

## 2018-06-19 DIAGNOSIS — J449 Chronic obstructive pulmonary disease, unspecified: Secondary | ICD-10-CM

## 2018-06-19 LAB — NITRIC OXIDE: NITRIC OXIDE: 26

## 2018-06-19 MED ORDER — FLUTICASONE-UMECLIDIN-VILANT 100-62.5-25 MCG/INH IN AEPB: 1.0000 | INHALATION_SPRAY | Freq: Every day | RESPIRATORY_TRACT | 11 refills | Status: DC

## 2018-06-19 NOTE — Patient Instructions (Signed)
COPD-asthma overlap syndrome with worsening symptoms and worsening spirometry testing: For now continue taking Stiolto I want to give you a sample of Trelegy when they are available, we do not have any today but hopefully by Monday of next week we will have some so please call us so that we can provide you with a sample Take Trelegy once daily, if it is helpful please call me to let me know Use albuterol as needed for chest tightness wheezing or shortness of breath Practice good hand hygiene Stay active Keep immunizations up-to-date: Influenza, pneumonia vaccines  We will plan on seeing you back in 4 to 6 months or sooner if needed

## 2018-06-19 NOTE — Progress Notes (Signed)
Subjective:    Patient ID: Curtis [name], male    DOB: Jul 16, 1945, 73 y.o.   MRN: [id number]  Synopsis: Chronic obstructive asthma and COPD first seen in 2014 . Previously followed by Dr. Velora [name] here in town, had severe asthma as a child. Smoked 0.5 ppd for about 30 years, quit in 1990's Previously followed by Dr. Caprice [name]   HPI  Chief Complaint  Patient presents with  . Follow-up    1 yr f/u for COPD/asthma. States his breathing has slowly began to get worse. Increased chest tightness. Has been using his ProAir a lot more.    Curtis [name] has not been doing very well for the last several months.  He says that he has had progressive chest tightness and shortness of breath.  He says he still capable of playing 9 holes of golf without too much difficulty but he has noticed that his "lung capacity" has worsened over the year.  He says that he has not been sick with bronchitis or pneumonia.  He ran out of Stiolto about 3 days ago but he said that the symptoms preceded that. 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  He says there is no mold or mildew that he is aware of in the home.  He smokes marijuana about once every 2 months but no more frequent than that.  Past Medical History:  Diagnosis Date  . Asthma   . COPD (chronic obstructive pulmonary disease) ([place])   . Prostate cancer ([place])   . S/P radiation therapy 08/12/2014 through 09/25/2014     Prostate bed 6600 cGy in 33 sessions   . Shortness of breath    with exertion     Review of Systems  Constitutional: Positive for fatigue. Negative for chills and fever.  HENT: Negative for postnasal drip, rhinorrhea and sinus pressure.   Respiratory: Positive for cough and shortness of breath.  Negative for wheezing.   Cardiovascular: Negative for chest pain, palpitations and leg swelling.  Psychiatric/Behavioral: Negative for suicidal ideas (.dbmexam).       Objective:   Physical Exam Vitals:   06/19/18 1547  BP: 116/74  Pulse: 71  SpO2: 97%  Weight: 203 lb 9.6 oz (92.4 kg)  Height: 5\' 8"  (1.727 m)   RA  Gen: well appearing HENT: OP clear, TM's clear, neck supple PULM: Upper airway wheeze and lower airway wheezing noted B, normal percussion CV: RRR, no mgr, trace edema GI: BS+, soft, nontender Derm: no cyanosis or rash Psyche: normal mood and affect   PFT 12/31/2013 Spirometry> Ratio 60%, FEV1 2.37L (71% pred) 06/2018 Spirometry: Ratio 58%, FEV1 1.6L (53% pred), FVC 2.7 (67% pred)  Exhaled nitric oxide testing: June 19, 1998 2027 ppb  Chest imaging November 2017 CT chest images personally reviewed showing resolution of the right upper lobe nodule but there are an increased amount of airway thickening bilaterally with some tree in bud abnormalities; re-reviewed in clinic today, some mucus plugging bases noted        Assessment & Plan:   COPD with asthma (Curtis [name]) - Plan: Spirometry with graph, Nitric oxide  Discussion: Curtis [name] has definitely had a progression of his airflow obstruction and is wheezing today on physical exam.  When I review the images from his CT chest from 2017 it is consistent with  images of seen in multiple prior patients with a heavy asthma component to their COPD and many years of airways disease.  Specifically I mean he has a lot of airway thickening and mucus plugging.  Surprisingly his exhaled nitric oxide test was not that elevated today but I still think he would benefit from an inhaled steroid given his heavy asthma history over the years.  Plan: COPD-asthma overlap syndrome with worsening symptoms and worsening spirometry testing: For now continue taking Stiolto I want to give you a sample of Trelegy when they are available, we do  not have any today but hopefully by Monday of next week we will have some so please call us so that we can provide you with a sample Take Trelegy once daily, if it is helpful please call me to let me know Use albuterol as needed for chest tightness wheezing or shortness of breath Practice good hand hygiene Stay active Keep immunizations up-to-date: Influenza, pneumonia vaccines  We will plan on seeing you back in 4 to 6 months or sooner if needed  > 50% of this 25 min visit spent face to face   Current Outpatient Medications:  .  acetaminophen (TYLENOL) 325 MG tablet, Take 650 mg by mouth every 6 (six) hours as needed for mild pain. , Disp: , Rfl:  .  albuterol (PROVENTIL HFA;VENTOLIN HFA) 108 (90 Base) MCG/ACT inhaler, Inhale 2 puffs into the lungs every 6 (six) hours as needed. For shortness of breath (Patient taking differently: Inhale 2 puffs into the lungs every 6 (six) hours as needed for shortness of breath. For shortness of breath), Disp: 1 Inhaler, Rfl: 0 .  aspirin 325 MG tablet, Take 325 mg by mouth daily., Disp: , Rfl:  .  Tiotropium Bromide-Olodaterol (STIOLTO RESPIMAT) 2.5-2.5 MCG/ACT AERS, Inhale 2 puffs into the lungs daily., Disp: 4 g, Rfl: 0

## 2018-06-19 NOTE — Addendum Note (Signed)
Addended by: Len Blalock on: 06/19/2018 04:30 PM   Modules accepted: Orders

## 2018-06-20 ENCOUNTER — Other Ambulatory Visit: Payer: Self-pay

## 2018-06-20 MED ORDER — ALBUTEROL SULFATE HFA 108 (90 BASE) MCG/ACT IN AERS: 2.0000 | INHALATION_SPRAY | Freq: Four times a day (QID) | RESPIRATORY_TRACT | 5 refills | Status: DC | PRN

## 2019-01-02 ENCOUNTER — Telehealth: Payer: Self-pay | Admitting: Pulmonary Disease

## 2019-01-02 NOTE — Progress Notes (Signed)
@Patient  ID: Curtis [name], male    DOB: [birth year], 73 y.o.   MRN: [id number]  Chief Complaint  Patient presents with  . Follow-up    Inhaler issues, ACOS follow up     Referring provider: Hulan Fess, MD  HPI:  73 year old male former smoker followed in our office for asthma COPD overlap syndrome  Smoker/ Smoking History: Former smoker.  Quit 1995.  15-pack-year smoking history. Maintenance:  Trelegy Ellipta Pt of: Dr. Lake Bells   01/03/2019  - Visit   73 year old male former smoker followed in our office for asthma COPD overlap syndrome.  Patient still smokes marijuana 2 times monthly.  Patient also partakes in edible marijuana from time to time.  Patient is a former patient of Dr. Velora Heckler.  He is also presenting to our office today to be evaluated since he thinks he is having worsening sore throat due to Trelegy Ellipta.  Patient reports that he has had a sore throat from trilogy since March/2020.  He reports has been rinsing out his mouth afterwards.  Patient does not like to take high amounts of steroids.  Patient has used multiple inhalers in the past.  He reports that he is used Serevent, [employer], Symbicort, and some others that he cannot remember.  He does not feel that he has had any issues with Stiolto Respimat or Symbicort.  He would prefer to be on a lower dose ICS in the future.  Patient also has significant concerns regarding cost as he is currently in the Medicare donut hole.   Patient reports that he had an allergy work-up many years ago that informed him that he has multiple triggers: Feathers, dust, cats, ragweed, seasonal changes, crpe myrtles as well as mold.  Patient is not currently taking antihistamine.      Tests:   PFT 12/31/2013 Spirometry> Ratio 60%, FEV1 2.37L (71% pred)  06/2018 Spirometry: Ratio 58%, FEV1 1.6L (53% pred), FVC 2.7 (67% pred)  Chest imaging November 2017 CT chest images showing resolution of the right upper lobe nodule  but there are an increased amount of airway thickening bilaterally with some tree in bud abnormalities; some mucus plugging bases noted  07/03/2016-CBC with differential-eosinophils relative 14, eosinophils absolute 1.0   FENO:  Lab Results  Component Value Date   NITRICOXIDE 26 06/19/2018    PFT: No flowsheet data found.  Imaging: No results found.    Specialty Problems      Pulmonary Problems   COPD with asthma ([place])    Smoked 0.5 ppd for about 30 years, quit in 1990's Previously followed by Dr. Caprice Red 12/31/2013 Spirometry> Ratio 60%, FEV1 2.37L (71% pred) November 2017 CT chest images personally reviewed showing resolution of the right upper lobe nodule but there are an increased amount of airway thickening bilaterally with some tree in bud abnormalities      Allergic rhinitis    Patient reports he is triggers: Dust mites, cats, feathers, mold, ragweed, great myrtles  2018 shows significant peripheral eosinophilia and blood work      Lung nodule    CT chest 03/2016>Resolution of left lower lobe pulmonary nodule. The left upper lobe pulmonary nodule is also decreased in size consistent with a benign etiology.         Allergies  Allergen Reactions  . Penicillins Other (See Comments)    "Negative feeling" Has patient had a PCN reaction causing immediate rash, facial/tongue/throat swelling, SOB or lightheadedness with hypotension: No Has patient had a PCN reaction causing  severe rash involving mucus membranes or skin necrosis: No Has patient had a PCN reaction that required hospitalization: No Has patient had a PCN reaction occurring within the last 10 years: No If all of the above answers are "NO", then may proceed with Cephalosporin use.    Immunization History  Administered Date(s) Administered  . Influenza Split 02/27/2016  . Influenza, High Dose Seasonal PF 02/10/2017  . Influenza,inj,Quad PF,6+ Mos 02/11/2015  . Influenza-Unspecified 02/12/2012   . Pneumococcal Conjugate-13 02/27/2016  . Pneumococcal-Unspecified 02/12/2012    Past Medical History:  Diagnosis Date  . Asthma   . COPD (chronic obstructive pulmonary disease) ([place])   . Prostate cancer ([place])   . S/P radiation therapy 08/12/2014 through 09/25/2014     Prostate bed 6600 cGy in 33 sessions   . Shortness of breath    with exertion    Tobacco History: Social History   Tobacco Use  Smoking Status Former Smoker  . Packs/day: 0.50  . Years: 30.00  . Pack years: 15.00  . Types: Cigarettes  . Quit date: 05/15/1993  . Years since quitting: 25.6  Smokeless Tobacco Never Used   Counseling given: Yes   Continue to not smoke  Outpatient Encounter Medications as of 01/03/2019  Medication Sig  . acetaminophen (TYLENOL) 325 MG tablet Take 650 mg by mouth every 6 (six) hours as needed for mild pain.   Marland Kitchen albuterol (PROVENTIL HFA;VENTOLIN HFA) 108 (90 Base) MCG/ACT inhaler Inhale 2 puffs into the lungs every 6 (six) hours as needed. For shortness of breath  . aspirin 325 MG tablet Take 325 mg by mouth daily.  . budesonide-formoterol (SYMBICORT) 80-4.5 MCG/ACT inhaler Inhale 2 puffs into the lungs 2 (two) times daily.  . Tiotropium Bromide-Olodaterol (STIOLTO RESPIMAT) 2.5-2.5 MCG/ACT AERS Inhale 2 puffs into the lungs daily. (Patient not taking: Reported on 01/03/2019)  . [DISCONTINUED] Fluticasone-Umeclidin-Vilant (TRELEGY ELLIPTA) 100-62.5-25 MCG/INH AEPB Inhale 1 puff into the lungs daily. (Patient not taking: Reported on 01/03/2019)   No facility-administered encounter medications on file as of 01/03/2019.      Review of Systems  Review of Systems  Constitutional: Negative for activity change, chills, fatigue, fever and unexpected weight change.  HENT: Positive for congestion (Baseline productive cough with slight yellow mucus). Negative for postnasal drip, rhinorrhea, sinus pressure, sinus pain and  sore throat.   Eyes: Negative.   Respiratory: Positive for cough (Productive, yellow mucus, baseline). Negative for shortness of breath and wheezing.   Cardiovascular: Negative for chest pain and palpitations.  Gastrointestinal: Negative for diarrhea, nausea and vomiting.  Endocrine: Negative.   Genitourinary: Negative.   Musculoskeletal: Negative.   Skin: Negative.   Allergic/Immunologic: Positive for environmental allergies.  Neurological: Negative for dizziness and headaches.  Psychiatric/Behavioral: Negative.  Negative for dysphoric mood. The patient is not nervous/anxious.   All other systems reviewed and are negative.    Physical Exam  BP 122/72 (BP Location: Left Arm, Cuff Size: Normal)   Pulse 71   Temp 97.8 F (36.6 C) (Oral)   Ht 5\' 8"  (1.727 m)   Wt 204 lb 9.6 oz (92.8 kg)   SpO2 96%   BMI 31.11 kg/m   Wt Readings from Last 5 Encounters:  01/03/19 204 lb 9.6 oz (92.8 kg)  06/19/18 203 lb 9.6 oz (92.4 kg)  04/12/17 195 lb 9.6 oz (88.7 kg)  07/02/16 195 lb (88.5 kg)  03/29/16 203 lb (92.1 kg)     Physical Exam Vitals signs and nursing note reviewed.  Constitutional:  General: He is not in acute distress.    Appearance: Normal appearance. He is normal weight.  HENT:     Head: Normocephalic and atraumatic.     Right Ear: Hearing, tympanic membrane, ear canal and external ear normal.     Left Ear: Hearing, tympanic membrane, ear canal and external ear normal.     Nose: Congestion and rhinorrhea present. No mucosal edema.     Right Turbinates: Not enlarged.     Left Turbinates: Not enlarged.     Mouth/Throat:     Mouth: Mucous membranes are dry.     Pharynx: Oropharynx is clear. No oropharyngeal exudate.     Comments: Post nasal drip Eyes:     Pupils: Pupils are equal, round, and reactive to light.  Neck:     Musculoskeletal: Normal range of motion.  Cardiovascular:     Rate and Rhythm: Normal rate and regular rhythm.     Pulses: Normal pulses.      Heart sounds: Normal heart sounds. No murmur.  Pulmonary:     Effort: Pulmonary effort is normal. No respiratory distress.     Breath sounds: Normal breath sounds. No decreased breath sounds, wheezing or rales.  Abdominal:     General: Bowel sounds are normal. There is no distension.     Palpations: Abdomen is soft.     Tenderness: There is no abdominal tenderness.  Musculoskeletal:     Right lower leg: No edema.     Left lower leg: No edema.  Lymphadenopathy:     Cervical: No cervical adenopathy.  Skin:    General: Skin is warm and dry.     Capillary Refill: Capillary refill takes less than 2 seconds.     Findings: No erythema or rash.  Neurological:     General: No focal deficit present.     Mental Status: He is alert and oriented to person, place, and time.     Motor: No weakness.     Coordination: Coordination normal.     Gait: Gait is intact. Gait normal.  Psychiatric:        Mood and Affect: Mood normal.        Behavior: Behavior normal. Behavior is cooperative.        Thought Content: Thought content normal.        Judgment: Judgment normal.      Lab Results:  CBC    Component Value Date/Time   WBC 6.6 07/03/2016 0849   RBC 4.88 07/03/2016 0849   HGB 14.5 07/03/2016 0849   HCT 41.8 07/03/2016 0849   PLT 218 07/03/2016 0849   MCV 85.7 07/03/2016 0849   MCH 29.7 07/03/2016 0849   MCHC 34.7 07/03/2016 0849   RDW 12.8 07/03/2016 0849   LYMPHSABS 1.1 07/03/2016 0849   MONOABS 0.5 07/03/2016 0849   EOSABS 1.0 (H) 07/03/2016 0849   BASOSABS 0.0 07/03/2016 0849    BMET    Component Value Date/Time   NA 139 07/03/2016 0849   K 3.8 07/03/2016 0849   CL 105 07/03/2016 0849   CO2 26 07/03/2016 0849   GLUCOSE 136 (H) 07/03/2016 0849   BUN 18 07/03/2016 0849   CREATININE 1.19 07/03/2016 0849   CALCIUM 9.2 07/03/2016 0849   GFRNONAA >60 07/03/2016 0849   GFRAA >60 07/03/2016 0849    BNP No results found for: BNP  ProBNP No results found for: PROBNP     Assessment & Plan:   COPD with asthma ([place]) Likely asthma COPD overlap  syndrome 2018 peripheral eosinophilia Intolerant of Trelegy Ellipta Reports that he tolerated Symbicort in the past In donut hole Breath sounds clear to auscultation today  Plan: Start Symbicort 70 Worked with in clinic pharmacy team today Stop Trelegy Ellipta We recommend that you do not smoke marijuana Start taking daily antihistamine We recommend blood work today of a CBC with differential as well as an IgE, you reported that you will consider doing this in a week We recommended doing pulmonary function testing, you reported today that he will think about this Keep scheduled follow-up with Dr. Loanne Drilling in October/2020  If patient remains stable may be candidate for as needed Symbicort 80 use.  Would recommend the patient take Symbicort 80 as instructed until next office visit with Dr. Loanne Drilling.  Allergic rhinitis Plan: We recommend that he start taking a daily antihistamine, he stated that he will consider doing this Continue nasal saline rinses Lab work today  Medication management Patient in Medicare coverage  Plan: Patient educated on Symbicort 80 use with spacer Referral to clinic pharmacy team   Marijuana use Plan: We recommend not using marijuana    Return in about 6 weeks (around 02/14/2019), or if symptoms worsen or fail to improve, for Follow up with Dr. Loanne Drilling.   Lauraine Rinne, NP 01/03/2019   This appointment was 42 minutes long with over 50% of the time in direct face-to-face patient care, assessment, plan of care, and follow-up. This includes working with clinic pharmacy team.

## 2019-01-02 NOTE — Telephone Encounter (Signed)
Okay for patient to transition back to [employer].  I would recommend the patient be scheduled for a follow-up with our office.  I have availabilities tomorrow.  We could review this with the patient. If patient cannot come in for an appointment that is okay he can transition back to [place].  Patient does need to be established with another pulmonary provider in our office.  As Dr. Lake Bells is going to be working full-time on the inpatient and at [facility] here in [place].  Patient does need to be established in a 30-minute slot with another pulmonary provider in our office.  I would like for this to be completed over the next 6 to 8 weeks if at all possible.  Wyn Quaker, FNP

## 2019-01-02 NOTE — Telephone Encounter (Signed)
Call returned to patient, he states he started Trelegy back in February and he continues to have a consistent sore throat while using it. He reports he does not do well with the powder inhalers. He is requesting to go back to [employer]. He denies any other symptoms. I made him aware this may warrant a visit as it is a change in medication. Voiced understanding. I also made him aware due to time we may not call him back until tomorrow. States take your time there is not rush.   B Mack please advise. Thanks.

## 2019-01-02 NOTE — Telephone Encounter (Signed)
Called spoke with patient. He states Trelegy has already put him in the donut hole so will want samples at [place] with BPM tomorrow 8/21 at 1614.   Patient also tenatively scheduled with JE on October 8 @1330  in a 30 minute slot as patient established with new provider.  Nothing further needed at this time.

## 2019-01-02 NOTE — Telephone Encounter (Signed)
LMTCB

## 2019-01-03 ENCOUNTER — Telehealth: Payer: Self-pay

## 2019-01-03 ENCOUNTER — Ambulatory Visit: Payer: Medicare Other | Admitting: Pulmonary Disease

## 2019-01-03 ENCOUNTER — Other Ambulatory Visit: Payer: Self-pay

## 2019-01-03 ENCOUNTER — Encounter: Payer: Self-pay | Admitting: Pulmonary Disease

## 2019-01-03 VITALS — BP 122/72 | HR 71 | Temp 97.8°F | Ht 68.0 in | Wt 204.6 lb

## 2019-01-03 DIAGNOSIS — Z79899 Other long term (current) drug therapy: Secondary | ICD-10-CM

## 2019-01-03 DIAGNOSIS — J309 Allergic rhinitis, unspecified: Secondary | ICD-10-CM | POA: Diagnosis not present

## 2019-01-03 DIAGNOSIS — F1290 Cannabis use, unspecified, uncomplicated: Secondary | ICD-10-CM

## 2019-01-03 DIAGNOSIS — J449 Chronic obstructive pulmonary disease, unspecified: Secondary | ICD-10-CM | POA: Diagnosis not present

## 2019-01-03 MED ORDER — BUDESONIDE-FORMOTEROL FUMARATE 80-4.5 MCG/ACT IN AERO: 2.0000 | INHALATION_SPRAY | Freq: Two times a day (BID) | RESPIRATORY_TRACT | 12 refills | Status: DC

## 2019-01-03 NOTE — Assessment & Plan Note (Signed)
Plan: We recommend not using marijuana

## 2019-01-03 NOTE — Assessment & Plan Note (Signed)
Likely asthma COPD overlap syndrome 2018 peripheral eosinophilia Intolerant of Trelegy Ellipta Reports that he tolerated Symbicort in the past In donut hole Breath sounds clear to auscultation today  Plan: Start Symbicort 41 Worked with in clinic pharmacy team today Stop Trelegy Ellipta We recommend that you do not smoke marijuana Start taking daily antihistamine We recommend blood work today of a CBC with differential as well as an IgE, you reported that you will consider doing this in a week We recommended doing pulmonary function testing, you reported today that he will think about this Keep scheduled follow-up with Dr. Loanne Drilling in October/2020  If patient remains stable may be candidate for as needed Symbicort 80 use.  Would recommend the patient take Symbicort 80 as instructed until next office visit with Dr. Loanne Drilling.

## 2019-01-03 NOTE — Assessment & Plan Note (Signed)
Patient in Medicare coverage  Plan: Patient educated on Symbicort 80 use with spacer Referral to clinic pharmacy team

## 2019-01-03 NOTE — Progress Notes (Signed)
Pharmacy Note  Subjective:  Patient presents today to the [facility] to see Wyn Quaker, FNP.  Patient seen by the pharmacist for counseling on Symbicort inhaler.   Objective: Current Outpatient Medications on File Prior to Visit  Medication Sig Dispense Refill  . acetaminophen (TYLENOL) 325 MG tablet Take 650 mg by mouth every 6 (six) hours as needed for mild pain.     Marland Kitchen albuterol (PROVENTIL HFA;VENTOLIN HFA) 108 (90 Base) MCG/ACT inhaler Inhale 2 puffs into the lungs every 6 (six) hours as needed. For shortness of breath 1 Inhaler 5  . aspirin 325 MG tablet Take 325 mg by mouth daily.    . Tiotropium Bromide-Olodaterol (STIOLTO RESPIMAT) 2.5-2.5 MCG/ACT AERS Inhale 2 puffs into the lungs daily. (Patient not taking: Reported on 01/03/2019) 4 g 0   No current facility-administered medications on file prior to visit.      Assessment/Plan: 1. Symbicort 80/4.5 mg two puffs twice daily - Patient was counseled on the purpose, proper use, and adverse effects of Symbicort inhaler.  Instructed patient to rinse mouth with water after using in order to prevent fungal infection.  Patient verbalized understanding.  Reviewed appropriate use of maintenance vs rescue inhalers.  Stressed importance of using maintenance inhaler daily and rescue inhaler only as needed.  Patient verbalized understanding.  Demonstrated proper inhaler technique using Symbicort demo inhaler with spacer.  Patient able to demonstrate proper inhaler technique using teach back method. Patient was given sample and spacer in office today.  His inhaler was primed and able to administer first dose in office without issue. Pt provided educational handouts and PAP application to return to office.  Thank you for involving pharmacy to assist in providing Mr. Mcpeters's care  Drexel Iha, PharmD PGY2 Ambulatory Care Pharmacy Resident

## 2019-01-03 NOTE — Telephone Encounter (Addendum)
Patrice can you please schedule pt for a PFT before his visit with Dr. Loanne Drilling in October. Thank you.

## 2019-01-03 NOTE — Patient Instructions (Addendum)
You were seen today by Lauraine Rinne, NP  for:   1. COPD with asthma ([place])  Start Symbicort 80 >>> 2 puffs in the morning right when you wake up, rinse out your mouth after use, 12 hours later 2 puffs, rinse after use >>> Take this daily, no matter what >>> This is not a rescue inhaler   Pharmacy team has worked with you today regarding education on Symbicort use as well as spacer use  - Pulmonary function test; Future - IgE; Future - CBC with Differential/Platelet; Future  2. Allergic rhinitis, unspecified seasonality, unspecified trigger  - IgE; Future - CBC with Differential/Platelet; Future  Please start taking a daily antihistamine:  >>>choose one of: zyrtec, claritin, allegra, or xyzal  >>>these are over the counter medications  >>>can choose generic option  >>>take daily  >>>this medication helps with allergies, post nasal drip, and cough   Continue nasal saline rinses   We recommend today:  Orders Placed This Encounter  Procedures  . IgE    Standing Status:   Future    Standing Expiration Date:   01/03/2020  . CBC with Differential/Platelet    Standing Status:   Future    Standing Expiration Date:   01/03/2020  . Pulmonary function test    Standing Status:   Future    Standing Expiration Date:   01/03/2020    Order Specific Question:   Where should this test be performed?    Answer:   [facility]    Order Specific Question:   Full PFT: includes the following: basic spirometry, spirometry pre & post bronchodilator, diffusion capacity (DLCO), lung volumes    Answer:   Full PFT   Orders Placed This Encounter  Procedures  . IgE  . CBC with Differential/Platelet  . Pulmonary function test   Meds ordered this encounter  Medications  . budesonide-formoterol (SYMBICORT) 80-4.5 MCG/ACT inhaler    Sig: Inhale 2 puffs into the lungs 2 (two) times daily.    Dispense:  1 Inhaler    Refill:  12    Follow Up:    Return in about 6 weeks (around 02/14/2019), or  if symptoms worsen or fail to improve, for Follow up with Dr. Loanne Drilling.   Please do your part to reduce the spread of COVID-19:      Reduce your risk of any infection  and COVID19 by using the similar precautions used for avoiding the common cold or flu:  Marland Kitchen Wash your hands often with soap and warm water for at least 20 seconds.  If soap and water are not readily available, use an alcohol-based hand sanitizer with at least 60% alcohol.  . If coughing or sneezing, cover your mouth and nose by coughing or sneezing into the elbow areas of your shirt or coat, into a tissue or into your sleeve (not your hands). Langley Gauss A MASK when in public  . Avoid shaking hands with others and consider head nods or verbal greetings only. . Avoid touching your eyes, nose, or mouth with unwashed hands.  . Avoid close contact with people who are sick. . Avoid places or events with large numbers of people in one location, like concerts or sporting events. . If you have some symptoms but not all symptoms, continue to monitor at home and seek medical attention if your symptoms worsen. . If you are having a medical emergency, call 911.   ADDITIONAL HEALTHCARE OPTIONS FOR PATIENTS  [facility] / e-Visit: [email or web address]  [facility]: [facility]: [phone]                   [facility]: [phone]     It is flu season:   >>> Best ways to protect herself from the flu: Receive the yearly flu vaccine, practice good hand hygiene washing with soap and also using hand sanitizer when available, eat a nutritious meals, get adequate rest, hydrate appropriately   Please contact the office if your symptoms worsen or you have concerns that you are not improving.   Thank you for choosing [facility] for your healthcare, and for allowing [place] to partner with you on your healthcare journey. I  am thankful to be able to provide care to you today.   Wyn Quaker FNP-C

## 2019-01-03 NOTE — Addendum Note (Signed)
Addended by: Lauraine Rinne on: 01/03/2019 08:05 PM   Modules accepted: Orders

## 2019-01-03 NOTE — Assessment & Plan Note (Signed)
Plan: We recommend that he start taking a daily antihistamine, he stated that he will consider doing this Continue nasal saline rinses Lab work today

## 2019-01-05 NOTE — Progress Notes (Signed)
Reviewed, agree 

## 2019-01-08 NOTE — Telephone Encounter (Signed)
Called and scheduled pft on 02/20/2019-pr

## 2019-02-11 ENCOUNTER — Other Ambulatory Visit: Payer: Self-pay | Admitting: Pulmonary Disease

## 2019-02-12 ENCOUNTER — Other Ambulatory Visit: Payer: Self-pay | Admitting: Urology

## 2019-02-14 ENCOUNTER — Other Ambulatory Visit: Payer: Self-pay

## 2019-02-14 ENCOUNTER — Encounter (HOSPITAL_BASED_OUTPATIENT_CLINIC_OR_DEPARTMENT_OTHER): Payer: Self-pay | Admitting: *Deleted

## 2019-02-14 ENCOUNTER — Telehealth: Payer: Self-pay | Admitting: Pulmonary Disease

## 2019-02-14 MED ORDER — BUDESONIDE-FORMOTEROL FUMARATE 80-4.5 MCG/ACT IN AERO: 2.0000 | INHALATION_SPRAY | Freq: Two times a day (BID) | RESPIRATORY_TRACT | 0 refills | Status: DC

## 2019-02-14 NOTE — Telephone Encounter (Signed)
Called and spoke with Curtis [name]. Curtis [name] stated he is scheduled with Dr. Loanne Drilling 02/20/19, but is needing a sample of Symbicort until he is seen in office. Symbicort 80 sample placed at front for pick up.  Per last Ov 01/03/19, with Brian,NP-   Start Symbicort 80 >>> 2 puffs in the morning right when you wake up, rinse out your mouth after use, 12 hours later 2 puffs, rinse after use >>> Take this daily, no matter what >>> This is not a rescue inhaler   Pharmacy team has worked with you today regarding education on Symbicort use as well as spacer use

## 2019-02-14 NOTE — Progress Notes (Signed)
Spoke w/ via phone for pre-op interview---Curtis [name] needs dos----   none           [name] results------none COVID test ------02-17-2019 for pulmonary function test at [facility] 02-20-2019, patient aware to quarantine after 02-17-2019 covid test and after pulmonary function test 02-20-2019 at [facility] at -------1030 02-21-2019 NPO after ------midnight Medications to take morning of surgery -----albuterol inhaler prn/bring inhaler, symbicort Diabetic medication -----n/a Patient Special Instructions ----- Pre-Op special Istructions ----- Patient verbalized understanding of instructions that were given at this phone interview. Patient denies shortness of breath, chest pain, fever, cough a this phone interview.

## 2019-02-17 ENCOUNTER — Other Ambulatory Visit (HOSPITAL_COMMUNITY): Payer: Medicare Other

## 2019-02-18 ENCOUNTER — Other Ambulatory Visit (HOSPITAL_COMMUNITY)
Admission: RE | Admit: 2019-02-18 | Discharge: 2019-02-18 | Disposition: A | Discharge status: Home / Self Care | Payer: Medicare Other | Source: Ambulatory Visit | Attending: Pulmonary Disease | Admitting: Pulmonary Disease

## 2019-02-18 DIAGNOSIS — Z01812 Encounter for preprocedural laboratory examination: Secondary | ICD-10-CM | POA: Insufficient documentation

## 2019-02-18 DIAGNOSIS — Z20828 Contact with and (suspected) exposure to other viral communicable diseases: Secondary | ICD-10-CM | POA: Diagnosis not present

## 2019-02-18 LAB — SARS CORONAVIRUS 2 (TAT 6-24 HRS): SARS Coronavirus 2: NEGATIVE

## 2019-02-20 ENCOUNTER — Other Ambulatory Visit: Payer: Self-pay

## 2019-02-20 ENCOUNTER — Encounter: Payer: Self-pay | Admitting: Pulmonary Disease

## 2019-02-20 ENCOUNTER — Ambulatory Visit (INDEPENDENT_AMBULATORY_CARE_PROVIDER_SITE_OTHER): Payer: Medicare Other | Admitting: Pulmonary Disease

## 2019-02-20 ENCOUNTER — Ambulatory Visit: Payer: Medicare Other | Admitting: Pulmonary Disease

## 2019-02-20 VITALS — BP 128/68 | HR 65 | Temp 97.6°F | Ht 68.5 in | Wt 204.8 lb

## 2019-02-20 DIAGNOSIS — J449 Chronic obstructive pulmonary disease, unspecified: Secondary | ICD-10-CM | POA: Diagnosis not present

## 2019-02-20 LAB — PULMONARY FUNCTION TEST
DL/VA % pred: 111 %
DL/VA: 4.49 ml/min/mmHg/L
DLCO unc % pred: 123 %
DLCO unc: 30.08 ml/min/mmHg
FEF 25-75 Post: 1.46 L/sec
FEF 25-75 Pre: 1.37 L/sec
FEF2575-%Change-Post: 6 %
FEF2575-%Pred-Post: 65 %
FEF2575-%Pred-Pre: 61 %
FEV1-%Change-Post: 0 %
FEV1-%Pred-Post: 77 %
FEV1-%Pred-Pre: 76 %
FEV1-Post: 2.32 L
FEV1-Pre: 2.3 L
FEV1FVC-%Change-Post: -10 %
FEV1FVC-%Pred-Pre: 92 %
FEV6-%Change-Post: 9 %
FEV6-%Pred-Post: 95 %
FEV6-%Pred-Pre: 86 %
FEV6-Post: 3.67 L
FEV6-Pre: 3.34 L
FEV6FVC-%Change-Post: -2 %
FEV6FVC-%Pred-Post: 103 %
FEV6FVC-%Pred-Pre: 105 %
FVC-%Change-Post: 12 %
FVC-%Pred-Post: 92 %
FVC-%Pred-Pre: 82 %
FVC-Post: 3.79 L
FVC-Pre: 3.38 L
Post FEV1/FVC ratio: 61 %
Post FEV6/FVC ratio: 97 %
Pre FEV1/FVC ratio: 68 %
Pre FEV6/FVC Ratio: 99 %
RV % pred: 125 %
RV: 3.03 L
TLC % pred: 114 %
TLC: 7.7 L

## 2019-02-20 MED ORDER — BUDESONIDE-FORMOTEROL FUMARATE 80-4.5 MCG/ACT IN AERO: 2.0000 | INHALATION_SPRAY | Freq: Two times a day (BID) | RESPIRATORY_TRACT | 0 refills | Status: DC

## 2019-02-20 NOTE — Progress Notes (Signed)
This will be reviewed by Dr. Loanne Drilling with the patient at their office visit today.  Wyn Quaker FNP

## 2019-02-20 NOTE — Progress Notes (Signed)
Full PFT performed today. °

## 2019-02-20 NOTE — Progress Notes (Signed)
Subjective:   PATIENT ID: Curtis [name] GENDER: male DOB: 16-Sep-1945, MRN: [id number]   HPI  Chief Complaint  Patient presents with  . Follow-up    Reason for Visit: Follow-up   [name] is 73 year old male former smoker with asthma-COPD overlap syndrome who presents for follow-up.  He is a former Dr. [place] [name] patient. He has been on Trelegy, Serevent, Stiolto Respimat and Symbicort. He did not tolerate Trelegy due to right sided throat irritation. Currently he is compliant on Symbicort with spacer. His asthma typically is triggered seasonally in the fall and spring and with illness. He is active and is able to play 16 rounds of gold last week. He will have shortness of breath when walking upstairs. He reports upper airway congestion that he produces (not from the lung).   He is planned for surgery for urethral stricture under anesthesia tomorrow.   Social History: Former smoker. Quit in 1995. Occasional MJ once a month.  I have personally reviewed patient's past medical/family/social history, allergies, current medications.  Past Medical History:  Diagnosis Date  . Asthma   . COPD (chronic obstructive pulmonary disease) ([place])   . DJD (degenerative joint disease)    of back  . Prostate cancer ([place])   . S/P radiation therapy 08/12/2014 through 09/25/2014     Prostate bed 6600 cGy in 33 sessions   . Shortness of breath    with exertion     Family History  Problem Relation Age of Onset  . Cancer Father        prostate  . Bipolar disorder Paternal Grandfather      Social History   Occupational History  . Occupation: owns Presenter, broadcasting  Tobacco Use  . Smoking status: Former Smoker    Packs/day: 0.50    Years: 32.00    Pack years: 16.00    Types: Cigarettes    Start date: 1963    Quit date: 05/15/1993    Years since quitting: 25.7  . Smokeless tobacco: Never Used  Substance and Sexual  Activity  . Alcohol use: Yes    Comment: 2 glasses wine at night  . Drug use: Yes    Types: Marijuana    Comment: 2x a month last used 1 month ago as of 02-14-2019  . Sexual activity: Yes    Allergies  Allergen Reactions  . Penicillins Other (See Comments)    "Negative feeling" Has patient had a PCN reaction causing immediate rash, facial/tongue/throat swelling, SOB or lightheadedness with hypotension: No Has patient had a PCN reaction causing severe rash involving mucus membranes or skin necrosis: No Has patient had a PCN reaction that required hospitalization: No Has patient had a PCN reaction occurring within the last 10 years: No If all of the above answers are "NO", then may proceed with Cephalosporin use.     Outpatient Medications Prior to Visit  Medication Sig Dispense Refill  . albuterol (PROVENTIL HFA;VENTOLIN HFA) 108 (90 Base) MCG/ACT inhaler Inhale 2 puffs into the lungs every 6 (six) hours as needed. For shortness of breath 1 Inhaler 5  . aspirin 325 MG tablet Take 325 mg by mouth daily.    . budesonide-formoterol (SYMBICORT) 80-4.5 MCG/ACT inhaler Inhale 2 puffs into the lungs 2 (two) times daily. 1 Inhaler 12   No facility-administered medications prior to visit.     Review of Systems  Constitutional: Negative for chills, diaphoresis, fever, malaise/fatigue and weight loss.  HENT: Negative for congestion, ear pain and  sore throat.   Respiratory: Positive for shortness of breath. Negative for cough, hemoptysis, sputum production and wheezing.   Cardiovascular: Negative for chest pain, palpitations and leg swelling.  Gastrointestinal: Negative for abdominal pain, heartburn and nausea.  Genitourinary: Negative for frequency.  Musculoskeletal: Negative for joint pain and myalgias.  Skin: Negative for itching and rash.  Neurological: Negative for dizziness, weakness and headaches.  Endo/Heme/Allergies: Does not bruise/bleed easily.  Psychiatric/Behavioral: Negative  for depression. The patient is not nervous/anxious.      Objective:   Vitals:   02/20/19 1331  Temp: 97.6 F (36.4 C)  TempSrc: Temporal  Weight: 204 lb 12.8 oz (92.9 kg)  Height: 5' 8.5" (1.74 m)      Physical Exam: General: Well-appearing, no acute distress HENT: [place], AT Eyes: EOMI, no scleral icterus Respiratory: Clear to auscultation bilaterally.  No crackles, wheezing or rales Cardiovascular: RRR, -M/R/G, no JVD GI: BS+, soft, nontender Extremities:-Edema,-tenderness Neuro: AAO x4, CNII-XII grossly intact Skin: Intact, no rashes or bruising Psych: Normal mood, normal affect  Data Reviewed:  Imaging: CT Chest 03/21/16 - Interval resolution of LLL lung nodule and decreased size of LUL lung nodule.  PFT: 02/20/19 FVC 3.79 (92%) FEV1 2.32 (77%) Ratio 61  TLC 114% DLCO 123%. Significant bronchodilator response. Interpretation: Mild reversible obstructive defect present  Labs: CBC    Component Value Date/Time   WBC 6.6 07/03/2016 0849   RBC 4.88 07/03/2016 0849   HGB 14.5 07/03/2016 0849   HCT 41.8 07/03/2016 0849   PLT 218 07/03/2016 0849   MCV 85.7 07/03/2016 0849   MCH 29.7 07/03/2016 0849   MCHC 34.7 07/03/2016 0849   RDW 12.8 07/03/2016 0849   LYMPHSABS 1.1 07/03/2016 0849   MONOABS 0.5 07/03/2016 0849   EOSABS 1.0 (H) 07/03/2016 0849   BASOSABS 0.0 07/03/2016 0849   BMET    Component Value Date/Time   NA 139 07/03/2016 0849   K 3.8 07/03/2016 0849   CL 105 07/03/2016 0849   CO2 26 07/03/2016 0849   GLUCOSE 136 (H) 07/03/2016 0849   BUN 18 07/03/2016 0849   CREATININE 1.19 07/03/2016 0849   CALCIUM 9.2 07/03/2016 0849   GFRNONAA >60 07/03/2016 0849   GFRAA >60 07/03/2016 0849    Imaging, labs and tests noted above have been reviewed independently by me.    Assessment & Plan:   Discussion: 73 year male with asthma/COPD overlap syndrome who presents for follow-up. Reviewed records and discussed care and patient's history extensively prior to  this visit.  COPD-Asthma Overlap Syndrome CONTINUE/REFILL Symbicort 88mcg two puffs in the morning and one puff in the evening CONTINUE Albuterol as needed for shortness of breath or wheezing Please get your flu shot after your procedure  Health Maintenance Immunization History  Administered Date(s) Administered  . Influenza Split 02/27/2016  . Influenza, High Dose Seasonal PF 02/10/2017  . Influenza,inj,Quad PF,6+ Mos 02/11/2015  . Influenza-Unspecified 02/12/2012  . Pneumococcal Conjugate-13 02/27/2016  . Pneumococcal-Unspecified 02/12/2012   CT Lung Screen - not qualified  Orders Placed This Encounter  Procedures  . CBC With Differential    Standing Status:   Future    Standing Expiration Date:   02/20/2020  . IgE    Standing Status:   Future    Standing Expiration Date:   02/20/2020   Meds ordered this encounter  Medications  . budesonide-formoterol (SYMBICORT) 80-4.5 MCG/ACT inhaler    Sig: Inhale 2 puffs into the lungs 2 (two) times daily.    Dispense:  1  Inhaler    Refill:  0    Order Specific Question:   Lot Number?    AnswerLF:[id number]    Order Specific Question:   Expiration Date?    Answer:   12/14/2019    Order Specific Question:   Manufacturer?    Answer:   [employer] [[id number]]    Order Specific Question:   Quantity    Answer:   1   Greater than 50% of this patient 25-minute office visit was spent face-to-face in counseling with the patient/family. We discussed medical diagnosis and treatment plan as noted.  Return in about 3 months (around 05/23/2019).  Smoot, MD [facility] 02/20/2019 1:33 PM  Office Number [phone]

## 2019-02-20 NOTE — Patient Instructions (Addendum)
COPD-Asthma Overlap Syndrome CONTINUE Symbicort 21mcg two puffs in the morning and one puff in the evening CONTINUE Albuterol as needed for shortness of breath or wheezing Please get your flu shot after your procedure  Don't hesitate to call for worsening respiratory symptoms for evaluation and treatment  When you have time, please stop by our lab for the following tests: CBC with diff IgE level

## 2019-02-21 ENCOUNTER — Ambulatory Visit (HOSPITAL_BASED_OUTPATIENT_CLINIC_OR_DEPARTMENT_OTHER)
Admission: RE | Admit: 2019-02-21 | Discharge: 2019-02-21 | Disposition: A | Discharge status: Home / Self Care | Payer: Medicare Other | Attending: Urology | Admitting: Urology

## 2019-02-21 ENCOUNTER — Encounter (HOSPITAL_BASED_OUTPATIENT_CLINIC_OR_DEPARTMENT_OTHER)
Admission: RE | Disposition: A | Discharge status: Home / Self Care | Payer: Self-pay | Source: Home / Self Care | Attending: Urology

## 2019-02-21 ENCOUNTER — Encounter (HOSPITAL_BASED_OUTPATIENT_CLINIC_OR_DEPARTMENT_OTHER): Payer: Self-pay | Admitting: Anesthesiology

## 2019-02-21 ENCOUNTER — Other Ambulatory Visit: Payer: Self-pay

## 2019-02-21 ENCOUNTER — Ambulatory Visit (HOSPITAL_BASED_OUTPATIENT_CLINIC_OR_DEPARTMENT_OTHER): Payer: Medicare Other | Admitting: Anesthesiology

## 2019-02-21 DIAGNOSIS — J449 Chronic obstructive pulmonary disease, unspecified: Secondary | ICD-10-CM | POA: Diagnosis not present

## 2019-02-21 DIAGNOSIS — N35912 Unspecified bulbous urethral stricture, male: Secondary | ICD-10-CM | POA: Insufficient documentation

## 2019-02-21 DIAGNOSIS — Z841 Family history of disorders of kidney and ureter: Secondary | ICD-10-CM | POA: Diagnosis not present

## 2019-02-21 DIAGNOSIS — Z9841 Cataract extraction status, right eye: Secondary | ICD-10-CM | POA: Diagnosis not present

## 2019-02-21 DIAGNOSIS — Z880 Allergy status to penicillin: Secondary | ICD-10-CM | POA: Diagnosis not present

## 2019-02-21 DIAGNOSIS — Z9842 Cataract extraction status, left eye: Secondary | ICD-10-CM | POA: Insufficient documentation

## 2019-02-21 DIAGNOSIS — N3041 Irradiation cystitis with hematuria: Secondary | ICD-10-CM | POA: Insufficient documentation

## 2019-02-21 DIAGNOSIS — Z79899 Other long term (current) drug therapy: Secondary | ICD-10-CM | POA: Diagnosis not present

## 2019-02-21 DIAGNOSIS — Z87891 Personal history of nicotine dependence: Secondary | ICD-10-CM | POA: Diagnosis not present

## 2019-02-21 DIAGNOSIS — Z7951 Long term (current) use of inhaled steroids: Secondary | ICD-10-CM | POA: Diagnosis not present

## 2019-02-21 DIAGNOSIS — Z7982 Long term (current) use of aspirin: Secondary | ICD-10-CM | POA: Diagnosis not present

## 2019-02-21 DIAGNOSIS — Z8042 Family history of malignant neoplasm of prostate: Secondary | ICD-10-CM | POA: Insufficient documentation

## 2019-02-21 DIAGNOSIS — M1990 Unspecified osteoarthritis, unspecified site: Secondary | ICD-10-CM | POA: Insufficient documentation

## 2019-02-21 DIAGNOSIS — N393 Stress incontinence (female) (male): Secondary | ICD-10-CM | POA: Diagnosis not present

## 2019-02-21 DIAGNOSIS — Z8546 Personal history of malignant neoplasm of prostate: Secondary | ICD-10-CM | POA: Diagnosis not present

## 2019-02-21 HISTORY — DX: Unspecified osteoarthritis, unspecified site: M19.90

## 2019-02-21 HISTORY — PX: URETHROTOMY: SHX1083

## 2019-02-21 SURGERY — CYSTOSCOPY, WITH URETHROTOMY
Anesthesia: General | Site: Bladder

## 2019-02-21 MED ORDER — ONDANSETRON HCL 4 MG/2ML IJ SOLN
INTRAMUSCULAR | Status: AC
  Filled 2019-02-21: qty 2

## 2019-02-21 MED ORDER — LIDOCAINE 2% (20 MG/ML) 5 ML SYRINGE
INTRAMUSCULAR | Status: AC
  Filled 2019-02-21: qty 5

## 2019-02-21 MED ORDER — ACETAMINOPHEN 500 MG PO TABS
ORAL_TABLET | ORAL | Status: AC
  Filled 2019-02-21: qty 2

## 2019-02-21 MED ORDER — FENTANYL CITRATE (PF) 100 MCG/2ML IJ SOLN
25.0000 ug | INTRAMUSCULAR | Status: DC | PRN
  Filled 2019-02-21: qty 1

## 2019-02-21 MED ORDER — PROPOFOL 10 MG/ML IV BOLUS
INTRAVENOUS | Status: AC
  Filled 2019-02-21: qty 20

## 2019-02-21 MED ORDER — STERILE WATER FOR IRRIGATION IR SOLN
Status: DC | PRN
  Administered 2019-02-21: 1

## 2019-02-21 MED ORDER — LACTATED RINGERS IV SOLN
INTRAVENOUS | Status: DC
  Administered 2019-02-21: 50 mL/h via INTRAVENOUS
  Filled 2019-02-21: qty 1000

## 2019-02-21 MED ORDER — PROPOFOL 10 MG/ML IV BOLUS
INTRAVENOUS | Status: DC | PRN
  Administered 2019-02-21: 150 mg via INTRAVENOUS

## 2019-02-21 MED ORDER — CIPROFLOXACIN IN D5W 400 MG/200ML IV SOLN
INTRAVENOUS | Status: AC
  Filled 2019-02-21: qty 200

## 2019-02-21 MED ORDER — FENTANYL CITRATE (PF) 100 MCG/2ML IJ SOLN
INTRAMUSCULAR | Status: DC | PRN
  Administered 2019-02-21: 50 ug via INTRAVENOUS
  Administered 2019-02-21 (×2): 25 ug via INTRAVENOUS

## 2019-02-21 MED ORDER — DEXAMETHASONE SODIUM PHOSPHATE 4 MG/ML IJ SOLN
INTRAMUSCULAR | Status: DC | PRN
  Administered 2019-02-21: 10 mg via INTRAVENOUS

## 2019-02-21 MED ORDER — FENTANYL CITRATE (PF) 100 MCG/2ML IJ SOLN
INTRAMUSCULAR | Status: AC
  Filled 2019-02-21: qty 2

## 2019-02-21 MED ORDER — ONDANSETRON HCL 4 MG/2ML IJ SOLN
INTRAMUSCULAR | Status: DC | PRN
  Administered 2019-02-21: 4 mg via INTRAVENOUS

## 2019-02-21 MED ORDER — HYDROCODONE-ACETAMINOPHEN 5-325 MG PO TABS: 1.0000 | ORAL_TABLET | ORAL | 0 refills | Status: DC | PRN

## 2019-02-21 MED ORDER — LIDOCAINE HCL (CARDIAC) PF 100 MG/5ML IV SOSY
PREFILLED_SYRINGE | INTRAVENOUS | Status: DC | PRN
  Administered 2019-02-21: 60 mg via INTRAVENOUS

## 2019-02-21 MED ORDER — CIPROFLOXACIN IN D5W 400 MG/200ML IV SOLN
400.0000 mg | INTRAVENOUS | Status: AC
  Administered 2019-02-21: 12:00:00 400 mg via INTRAVENOUS
  Filled 2019-02-21: qty 200

## 2019-02-21 MED ORDER — ACETAMINOPHEN 500 MG PO TABS
1000.0000 mg | ORAL_TABLET | Freq: Once | ORAL | Status: AC
  Administered 2019-02-21: 11:00:00 1000 mg via ORAL
  Filled 2019-02-21: qty 2

## 2019-02-21 MED ORDER — DEXAMETHASONE SODIUM PHOSPHATE 10 MG/ML IJ SOLN
INTRAMUSCULAR | Status: AC
  Filled 2019-02-21: qty 1

## 2019-02-21 SURGICAL SUPPLY — 18 items
BAG DRAIN URO-CYSTO SKYTR STRL (DRAIN) ×3 IMPLANT
BAG URINE DRAINAGE (UROLOGICAL SUPPLIES) ×3 IMPLANT
BAG URINE LEG 500ML (DRAIN) IMPLANT
CATH FOLEY 2W COUNCIL 5CC 18FR (CATHETERS) ×3 IMPLANT
CATH FOLEY 2WAY SLVR 30CC 20FR (CATHETERS) IMPLANT
CATH FOLEY 3WAY 30CC 22F (CATHETERS) IMPLANT
CLOTH BEACON ORANGE TIMEOUT ST (SAFETY) ×3 IMPLANT
GLOVE BIO SURGEON STRL SZ7.5 (GLOVE) ×3 IMPLANT
GOWN STRL REUS W/TWL XL LVL3 (GOWN DISPOSABLE) ×3 IMPLANT
GUIDEWIRE STR DUAL SENSOR (WIRE) ×3 IMPLANT
KIT TURNOVER CYSTO (KITS) ×3 IMPLANT
MANIFOLD NEPTUNE II (INSTRUMENTS) ×3 IMPLANT
PACK CYSTO (CUSTOM PROCEDURE TRAY) ×3 IMPLANT
PLUG CATH AND CAP STER (CATHETERS) ×3 IMPLANT
SYR 30ML LL (SYRINGE) IMPLANT
TUBE CONNECTING 12'X1/4 (SUCTIONS)
TUBE CONNECTING 12X1/4 (SUCTIONS) IMPLANT
TUBING UROLOGY SET (TUBING) IMPLANT

## 2019-02-21 NOTE — H&P (Signed)
CC/HPI: Cc: Gross hematuria.  HPI:  12/31/2018  73 year old male with a history of prostate cancer. Dr. Risa Grill performed a robotic-assisted laparoscopic prostatectomy in October 2013. This revealed Gleason 4+3 adenocarcinoma the prostate with bilateral extracapsular extension and left-sided seminal vesicle involvement. He subsequently underwent salvage radiation. He self-reports a PSA in the fall being 0.05. He presents today for evaluation of 3 episodes of painless gross hematuria. These all followed having a bowel movement where he strained a great deal. Otherwise, no episodes of gross hematuria. He has occasional stress urinary incontinence and wears one pad per day. He gets very minor erection but is not sexually active and has no interest in treatment of this.   01/22/2019  Patient has had no further gross hematuria. Urinalysis was negative today. His CT scan did show a mildly thick-walled bladder. Also, PSA mildly increased to 0.135. He declines cystoscopy.   02/11/2019  Patient has had 2 episodes of gross hematuria since I last saw him. This has prompted him to return for a cystoscopy. Again, this occurred after a bowel movement.     ALLERGIES: Penicillins - patient reports "very mild reaction"    MEDICATIONS: Aspirin 325 MG Oral Tablet Oral  Proair Hfa  Symbicort     GU PSH: Cysto Dilate Stricture (M or F) - 2013 Laparoscopy; Lymphadenectomy - 2013 Locm 300-399Mg /Ml Iodine,1Ml - 01/15/2019 Prostate Needle Biopsy - 2013 Robotic Radical Prostatectomy - 2013       PSH Notes: Prostatect Retropubic Radical W/ Nerve Sparing Laparoscopic, Cystoscopy For Urethral Stricture, Laparoscopy With Bilateral Total Pelvic Lymphadenectomy, Biopsy Of The Prostate Needle, Dilation Of Male Urethral Stricture, Primary Repair Of Ruptured Achilles Tendon  Minor skin surgery - removal of skin cancer 2020   NON-GU PSH: Cataract Surgery.., Bilateral - 12/30/2017 Repair Achilles Tendon -  2013 Tonsillectomy.. - 1958     GU PMH: Gross hematuria - 12/31/2018 Prostate Cancer (Stable) - 12/31/2018, Adenocarcinoma of prostate, - 2015 ED due to arterial insufficiency, Erectile dysfunction due to arterial insufficiency - 2014 Stress Incontinence, Male stress incontinence - 2014    NON-GU PMH: Encounter for general adult medical examination without abnormal findings, Encounter for preventive health examination - 2015 Asthma, Asthma - 2014 Muscle weakness (generalized), Muscle weakness - 2014 Other lack of coordination, Other lack of coordination - 2014 COPD    FAMILY HISTORY: 1 son - Son father deceased - Father mother deceased - Mother Prostate Cancer - Runs In Family    Notes: Mother deceased at age 65 d/t kidney sepsis  Father deceased at age 34 d/t pneumonia   SOCIAL HISTORY: Marital Status: Married Current Smoking Status: Patient does not smoke anymore. Has not smoked since 12/13/1993. Smoked for 20 years. Smoked 1/2 pack per day.   Tobacco Use Assessment Completed: Used Tobacco in last 30 days? Drinks 2 drinks per day.  Drinks 2 caffeinated drinks per day. 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     Notes: Former smoker, Environmental consultant In Usual Daily Activities, Living Independently With Spouse, Activities Of Daily Living, Exercise Habits, Alcohol Use, Caffeine Use   REVIEW OF SYSTEMS:    GU Review Male:   Patient denies frequent urination, hard to postpone urination, burning/ pain with urination, get up at night to urinate, leakage of urine, stream starts and stops, trouble starting your stream, have to strain to urinate , erection problems, and penile pain.  Gastrointestinal (Upper):   Patient denies nausea, vomiting, and indigestion/ heartburn.  Gastrointestinal (Lower):   Patient denies diarrhea and constipation.  Constitutional:   Patient denies fever, night sweats, weight loss, and fatigue.  Skin:   Patient denies skin rash/ lesion and itching.   Eyes:   Patient denies blurred vision and double vision.  Ears/ Nose/ Throat:   Patient denies sore throat and sinus problems.  Hematologic/Lymphatic:   Patient denies swollen glands and easy bruising.  Cardiovascular:   Patient denies leg swelling and chest pains.  Respiratory:   Patient denies cough and shortness of breath.  Endocrine:   Patient denies excessive thirst.  Musculoskeletal:   Patient denies back pain and joint pain.  Neurological:   Patient denies headaches and dizziness.  Psychologic:   Patient denies depression and anxiety.   Notes: hematuria    VITAL SIGNS:      02/11/2019 01:45 PM  BP 126/74 mmHg  Heart Rate 63 /min  Temperature 96.6 F / 35.8 C   PAST DATA REVIEWED:  Source Of History:  Patient  Records Review:   Previous Patient Records   01/02/19 09/10/13 04/09/13 10/16/12 07/17/12 05/10/12 11/25/11  PSA  Total PSA 0.135 ng/mL 0.10  0.08  0.06  0.05  0.04  7.27   Free PSA       0.37   % Free PSA       5     09/11/13  Hormones  Testosterone, Total 220     PROCEDURES:         Flexible Cystoscopy - 52000  Risks, benefits, and some of the potential complications of the procedure were discussed at length with the patient including infection, bleeding, voiding discomfort, urinary retention, fever, chills, sepsis, and others. All questions were answered. Informed consent was obtained. Antibiotic prophylaxis was given. Sterile technique and intraurethral analgesia were used.  Meatus:  Normal size. Normal location. Normal condition.  Urethra:  Approximately 8 French bulbar urethral stricture. It appeared short      The lower urinary tract was carefully examined. The procedure was well-tolerated and without complications. Antibiotic instructions were given. Instructions were given to call the office immediately for bloody urine, difficulty urinating, urinary retention, painful or frequent urination, fever, chills, nausea, vomiting or other illness. The patient  stated that he understood these instructions and would comply with them.         Urinalysis Dipstick Dipstick Cont'd  Color: Yellow Bilirubin: Neg mg/dL  Appearance: Clear Ketones: Neg mg/dL  Specific Gravity: 1.015 Blood: Neg ery/uL  pH: <=5.0 Protein: Neg mg/dL  Glucose: Neg mg/dL Urobilinogen: 0.2 mg/dL    Nitrites: Neg    Leukocyte Esterase: Neg leu/uL    ASSESSMENT:      ICD-10 Details  1 GU:   Radiation cystitis (with hematuria) - N30.41   2   Gross hematuria - R31.0   3   Prostate Cancer - C61      PLAN:           Schedule Labs: 5 Months - PSA          Document Letter(s):  Created for Patient: Clinical Summary         Notes:   Plan for direct visual internal urethrotomy. Risks and benefits discussed. We will inspect the bladder and if he needs a TURBT will proceed with that.   PSA in 5 months   CC: Dr. Rex Kras    Signed by Link Snuffer, III, M.D. on 02/11/19 at 2:17 PM (EDT)

## 2019-02-21 NOTE — Transfer of Care (Signed)
Immediate Anesthesia Transfer of Care Note  Patient: Curtis [name]  Procedure(s) Performed: Procedure(s) (LRB): DIRECT VISION INTERNAL URETHROTOMY POSSIBLE TRANSURETHRAL POSSIBLE RESECTION OF BLADDER TUMOR (N/A)  Patient Location: PACU  Anesthesia Type: General  Level of Consciousness: awake, sedated, patient cooperative and responds to stimulation  Airway & Oxygen Therapy: Patient Spontanous Breathing and Patient connected to [place] 02 and soft mask  Post-op Assessment: Report given to PACU RN, Post -op Vital signs reviewed and stable and Patient moving all extremities  Post vital signs: Reviewed and stable  Complications: No apparent anesthesia complications

## 2019-02-21 NOTE — Anesthesia Preprocedure Evaluation (Addendum)
Anesthesia Evaluation  Patient identified by MRN, date of birth, ID band Patient awake    Reviewed: Allergy & Precautions, NPO status , Patient's Chart, lab work & pertinent test results  Airway Mallampati: II  TM Distance: >3 FB Neck ROM: Full    Dental no notable dental hx.    Pulmonary shortness of breath and with exertion, asthma , COPD, former smoker,    Pulmonary exam normal breath sounds clear to auscultation       Cardiovascular negative cardio ROS Normal cardiovascular exam Rhythm:Regular Rate:Normal     Neuro/Psych negative neurological ROS  negative psych ROS   GI/Hepatic negative GI ROS, (+)     substance abuse  marijuana use,   Endo/Other  negative endocrine ROS  Renal/GU negative Renal ROS  negative genitourinary   Musculoskeletal  (+) Arthritis ,   Abdominal   Peds  Hematology negative hematology ROS (+)   Anesthesia Other Findings H/o prostate CA  Reproductive/Obstetrics                            Anesthesia Physical Anesthesia Plan  ASA: II  Anesthesia Plan: General   Post-op Pain Management:    Induction: Intravenous  PONV Risk Score and Plan: Ondansetron and Dexamethasone  Airway Management Planned: LMA  Additional Equipment:   Intra-op Plan:   Post-operative Plan: Extubation in OR  Informed Consent: I have reviewed the patients History and Physical, chart, labs and discussed the procedure including the risks, benefits and alternatives for the proposed anesthesia with the patient or authorized representative who has indicated his/her understanding and acceptance.     Dental advisory given  Plan Discussed with: CRNA  Anesthesia Plan Comments:         Anesthesia Quick Evaluation

## 2019-02-21 NOTE — Anesthesia Procedure Notes (Signed)
Procedure Name: LMA Insertion Date/Time: 02/21/2019 12:23 PM Performed by: Justice Rocher, CRNA Pre-anesthesia Checklist: Patient identified, Emergency Drugs available, Suction available and Patient being monitored Patient Re-evaluated:Patient Re-evaluated prior to induction Oxygen Delivery Method: Circle system utilized Preoxygenation: Pre-oxygenation with 100% oxygen Induction Type: IV induction Ventilation: Mask ventilation without difficulty LMA: LMA inserted LMA Size: 5.0 Number of attempts: 1 Airway Equipment and Method: Bite block Placement Confirmation: positive ETCO2 and breath sounds checked- equal and bilateral Tube secured with: Tape Dental Injury: Teeth and Oropharynx as per pre-operative assessment

## 2019-02-21 NOTE — Discharge Instructions (Addendum)
General instructions:     Your recent bladder surgery requires very little post hospital care but some definite precautions.  Despite the fact that no skin incisions were used, the area around the bladder incisions are raw and covered with scabs to promote healing and prevent bleeding. Certain precautions are needed to insure that the scabs are not disturbed over the next 2-4 weeks while the healing proceeds.  Because the raw surface inside your bladder and the irritating effects of urine you may expect frequency of urination and/or urgency (a stronger desire to urinate) and perhaps even getting up at night more often. This will usually resolve or improve slowly over the healing period. You may see some blood in your urine over the first 6 weeks. Do not be alarmed, even if the urine was clear for a while. Get off your feet and drink lots of fluids until clearing occurs. If you start to pass clots or don't improve call us.  Diet:  You may return to your normal diet immediately. Because of the raw surface of your bladder, alcohol, spicy foods, foods high in acid and drinks with caffeine may cause irritation or frequency and should be used in moderation. To keep your urine flowing freely and avoid constipation, drink plenty of fluids during the day (8-10 glasses). Tip: Avoid cranberry juice because it is very acidic.  Activity:  Your physical activity doesn't need to be restricted. However, if you are very active, you may see some blood in the urine. We suggest that you reduce your activity under the circumstances until the bleeding has stopped.  Bowels:  It is important to keep your bowels regular during the postoperative period. Straining with bowel movements can cause bleeding. A bowel movement every other day is reasonable. Use a mild laxative if needed, such as milk of magnesia 2-3 tablespoons, or 2 Dulcolax tablets. Call if you continue to have problems. If you had been taking narcotics for  pain, before, during or after your surgery, you may be constipated. Take a laxative if necessary.    Medication:  You should resume your pre-surgery medications unless told not to. In addition you may be given an antibiotic to prevent or treat infection. Antibiotics are not always necessary. All medication should be taken as prescribed until the bottles are finished unless you are having an unusual reaction to one of the drugs.   Post Anesthesia Home Care Instructions  Activity: Get plenty of rest for the remainder of the day. A responsible individual must stay with you for 24 hours following the procedure.  For the next 24 hours, DO NOT: -Drive a car -Paediatric nurse -Drink alcoholic beverages -Take any medication unless instructed by your physician -Make any legal decisions or sign important papers.  Meals: Start with liquid foods such as gelatin or soup. Progress to regular foods as tolerated. Avoid greasy, spicy, heavy foods. If nausea and/or vomiting occur, drink only clear liquids until the nausea and/or vomiting subsides. Call your physician if vomiting continues.  Special Instructions/Symptoms: Your throat may feel dry or sore from the anesthesia or the breathing tube placed in your throat during surgery. If this causes discomfort, gargle with warm salt water. The discomfort should disappear within 24 hours.  If you had a scopolamine patch placed behind your ear for the management of post- operative nausea and/or vomiting:  1. The medication in the patch is effective for 72 hours, after which it should be removed.  Wrap patch in a tissue and discard  in the trash. Wash hands thoroughly with soap and water. 2. You may remove the patch earlier than 72 hours if you experience unpleasant side effects which may include dry mouth, dizziness or visual disturbances. 3. Avoid touching the patch. Wash your hands with soap and water after contact with the patch.

## 2019-02-21 NOTE — Op Note (Signed)
Operative Note  Preoperative diagnosis:  1.  Bulbar urethral stricture 2.  Gross hematuria  Postoperative diagnosis: 1.  Bulbar urethral stricture 2.  Gross hematuria most likely secondary to radiation cystitis  Procedure(s): 1.  Direct visual internal urethrotomy  Surgeon: Link Snuffer, MD  Assistants: None  Anesthesia: General  Complications: None immediate  EBL: Minimal  Specimens: 1.  None  Drains/Catheters: 1.  18 French council tip catheter  Intraoperative findings: 1.  Normal anterior urethra 2.  Approximately 10 French bulbar urethral stricture 3.  Surgically absent prostate 4.  Some hypervascularity to the bladder but no obvious masses or erythema.  Indication: 73 year old male with a history of prostate cancer status post prostatectomy followed by salvage radiation was found to have bulbar urethral stricture on hematuria work-up.  He presents for the previously mentioned operation  Description of procedure:  The patient was identified and consent was obtained.  The patient was taken to the operating room and placed in the supine position.  The patient was placed under general anesthesia.  Perioperative antibiotics were administered.  The patient was placed in dorsal lithotomy.  Patient was prepped and draped in a standard sterile fashion and a timeout was performed.  A direct visual internal ureterotomy set was assembled.  The scope was inserted up to the level of the stricture.  A sensor wire was advanced through the scope and into the bladder.  The stricture was then incised at the 12 o'clock position until the scope could easily passed into the bladder.  I withdrew the scope keeping the wire in place followed by advancement of a 21 French rigid cystoscope into the bladder followed by complete cystoscopy.  No bladder tumors were seen.  No areas to biopsy.  I therefore withdrew the scope and advanced an 89 [place] council tip catheter over the wire and into the bladder.   This include the operation.  The patient tolerated the procedure well was stable postoperative.  Plan: Return in 1 week for catheter removal

## 2019-02-23 NOTE — Anesthesia Postprocedure Evaluation (Signed)
Anesthesia Post Note  Patient: Curtis [name]  Procedure(s) Performed: DIRECT VISION INTERNAL URETHROTOMY POSSIBLE TRANSURETHRAL POSSIBLE RESECTION OF BLADDER TUMOR (N/A Bladder)     Patient location during evaluation: PACU Anesthesia Type: General Level of consciousness: awake and alert Pain management: pain level controlled Vital Signs Assessment: post-procedure vital signs reviewed and stable Respiratory status: spontaneous breathing, nonlabored ventilation, respiratory function stable and patient connected to nasal cannula oxygen Cardiovascular status: blood pressure returned to baseline and stable Postop Assessment: no apparent nausea or vomiting Anesthetic complications: no    Last Vitals:  Vitals:   02/21/19 1330 02/21/19 1440  BP: (!) 143/81 140/81  Pulse: (!) 55 61  Resp: 16 14  Temp:    SpO2: 100% 100%    Last Pain:  Vitals:   02/21/19 1435  TempSrc:   PainSc: 1                  Channing Savich L Rox Mcgriff

## 2019-02-24 ENCOUNTER — Encounter (HOSPITAL_BASED_OUTPATIENT_CLINIC_OR_DEPARTMENT_OTHER): Payer: Self-pay | Admitting: Urology

## 2019-03-26 ENCOUNTER — Telehealth: Payer: Self-pay | Admitting: Pulmonary Disease

## 2019-03-26 MED ORDER — BUDESONIDE-FORMOTEROL FUMARATE 80-4.5 MCG/ACT IN AERO: 2.0000 | INHALATION_SPRAY | Freq: Two times a day (BID) | RESPIRATORY_TRACT | 12 refills | Status: DC

## 2019-03-26 NOTE — Telephone Encounter (Signed)
Rx sent to pharmacy for pt's symbicort. Called and spoke with pt letting him know this was done and pt verbalized understanding. Nothing further needed.

## 2019-11-12 ENCOUNTER — Telehealth: Payer: Self-pay | Admitting: Pulmonary Disease

## 2019-11-12 MED ORDER — ALBUTEROL SULFATE HFA 108 (90 BASE) MCG/ACT IN AERS: 2.0000 | INHALATION_SPRAY | Freq: Four times a day (QID) | RESPIRATORY_TRACT | 5 refills | Status: DC | PRN

## 2019-11-12 NOTE — Telephone Encounter (Signed)
Rx for pt's albuterol inhaler has been sent to preferred pharmacy. Nothing further needed. 

## 2020-03-25 ENCOUNTER — Other Ambulatory Visit: Payer: Self-pay | Admitting: Pulmonary Disease

## 2020-06-16 ENCOUNTER — Other Ambulatory Visit: Payer: Self-pay | Admitting: Pulmonary Disease

## 2020-07-10 ENCOUNTER — Other Ambulatory Visit: Payer: Self-pay | Admitting: Pulmonary Disease

## 2020-07-12 ENCOUNTER — Telehealth: Payer: Self-pay | Admitting: Pulmonary Disease

## 2020-07-12 MED ORDER — BUDESONIDE-FORMOTEROL FUMARATE 80-4.5 MCG/ACT IN AERO: 2.0000 | INHALATION_SPRAY | Freq: Two times a day (BID) | RESPIRATORY_TRACT | 0 refills | Status: DC

## 2020-07-12 NOTE — Telephone Encounter (Signed)
Refill has been sent to the pharmacy and pt has a pending appt

## 2020-07-13 ENCOUNTER — Other Ambulatory Visit: Payer: Self-pay

## 2020-07-13 ENCOUNTER — Ambulatory Visit: Payer: Medicare Other | Admitting: Pulmonary Disease

## 2020-07-13 ENCOUNTER — Encounter: Payer: Self-pay | Admitting: Pulmonary Disease

## 2020-07-13 VITALS — BP 110/66 | HR 73 | Temp 96.7°F | Ht 68.0 in | Wt 202.0 lb

## 2020-07-13 DIAGNOSIS — J449 Chronic obstructive pulmonary disease, unspecified: Secondary | ICD-10-CM | POA: Diagnosis not present

## 2020-07-13 MED ORDER — BUDESONIDE-FORMOTEROL FUMARATE 80-4.5 MCG/ACT IN AERO: 2.0000 | INHALATION_SPRAY | Freq: Two times a day (BID) | RESPIRATORY_TRACT | 5 refills | Status: DC

## 2020-07-13 NOTE — Progress Notes (Signed)
Subjective:   PATIENT ID: Curtis [name] GENDER: male DOB: 1945/10/02, MRN: [id number]   HPI  Chief Complaint  Patient presents with  . Follow-up    Has been out of symbicort over the weekend.  He just got the medication today.  He can tell that he was out of his meds.     Reason for Visit: Follow-up   [name] is 75 year old male former smoker with asthma-COPD overlap syndrome who presents for follow-up.  Synopsis: He is a former Curtis [name] patient. He has been on Trelegy, Serevent, Stiolto Respimat and Symbicort. He did not tolerate Trelegy due to right sided throat irritation. Currently he is compliant on Symbicort with spacer. His asthma typically is triggered seasonally in the fall and spring and with illness. He is active and is able to play 16 rounds of gold last week.   Since our last visit six months ago, he has been compliant with Symbicort and rarely uses his rescue inhaler. However he ran out over the weekend. Began having symptoms of mild cough, wheezing, chest tightness and shortness of breath. Denies fevers, chills. Despite these symptoms his activity has not been limited and is planning to go play golf. He knows his symptoms will resolve once he restarts his Symbicort.  Social History: Former smoker. Quit in 1995. Smokes MJ 1-2x a month. Now taking CBD   I have personally reviewed patient's past medical/family/social history/allergies/current medications.  Past Medical History:  Diagnosis Date  . Asthma   . COPD (chronic obstructive pulmonary disease) ([place])   . DJD (degenerative joint disease)    of back  . Prostate cancer ([place])   . S/P radiation therapy 08/12/2014 through 09/25/2014     Prostate bed 6600 cGy in 33 sessions   . Shortness of breath    with exertion     Family History  Problem Relation Age of Onset  . Cancer Father        prostate  . Bipolar disorder  Paternal Grandfather      Social History   Occupational History  . Occupation: owns [employer], [employer]  Tobacco Use  . Smoking status: Former Smoker    Packs/day: 0.50    Years: 32.00    Pack years: 16.00    Types: Cigarettes    Start date: 1963    Quit date: 05/15/1993    Years since quitting: 27.1  . Smokeless tobacco: Never Used  Vaping Use  . Vaping Use: Never used  Substance and Sexual Activity  . Alcohol use: Yes    Comment: 2 glasses wine at night  . Drug use: Yes    Types: Marijuana    Comment: 2x a month last used 1 month ago as of 02-14-2019  . Sexual activity: Yes    Allergies  Allergen Reactions  . Penicillins Other (See Comments)    "Negative feeling" Has patient had a PCN reaction causing immediate rash, facial/tongue/throat swelling, SOB or lightheadedness with hypotension: No Has patient had a PCN reaction causing severe rash involving mucus membranes or skin necrosis: No Has patient had a PCN reaction that required hospitalization: No Has patient had a PCN reaction occurring within the last 10 years: No If all of the above answers are "NO", then may proceed with Cephalosporin use.     Outpatient Medications Prior to Visit  Medication Sig Dispense Refill  . albuterol (VENTOLIN HFA) 108 (90 Base) MCG/ACT inhaler Inhale 2 puffs into the lungs every 6 (six) hours  as needed. For shortness of breath 18 g 5  . aspirin 325 MG tablet Take 325 mg by mouth daily.    . budesonide-formoterol (SYMBICORT) 80-4.5 MCG/ACT inhaler Inhale 2 puffs into the lungs in the morning and at bedtime. 10.2 each 0  . HYDROcodone-acetaminophen (NORCO) 5-325 MG tablet Take 1 tablet by mouth every 4 (four) hours as needed for moderate pain. 8 tablet 0  . budesonide-formoterol (SYMBICORT) 80-4.5 MCG/ACT inhaler Inhale 2 puffs into the lungs 2 (two) times daily. (Patient not taking: Reported on 07/13/2020) 1 Inhaler 0  . budesonide-formoterol (SYMBICORT) 80-4.5 MCG/ACT inhaler Inhale 2 puffs into the  lungs 2 (two) times daily. (Patient not taking: Reported on 07/13/2020) 1 Inhaler 12   No facility-administered medications prior to visit.    Review of Systems  Constitutional: Negative for chills, diaphoresis, fever, malaise/fatigue and weight loss.  HENT: Negative for congestion.   Respiratory: Positive for cough, shortness of breath and wheezing. Negative for hemoptysis and sputum production.   Cardiovascular: Negative for chest pain, palpitations and leg swelling.     Objective:   Vitals:   07/13/20 1527  BP: 110/66  Pulse: 73  Temp: (!) 96.7 F (35.9 C)  TempSrc: Tympanic  SpO2: 97%  Weight: 202 lb (91.6 kg)  Height: 5\' 8"  (1.727 m)   SpO2: 97 %   Physical Exam: General: Well-appearing, no acute distress HENT: [place], AT Eyes: EOMI, no scleral icterus Respiratory: Rare expiratory wheeze. Resolved after inspiration Cardiovascular: RRR, -M/R/G, no JVD Extremities:-Edema,-tenderness Neuro: AAO x4, CNII-XII grossly intact Psych: Normal mood, normal affect  Data Reviewed:  Imaging: CT Chest 03/21/16 - Interval resolution of LLL lung nodule and decreased size of LUL lung nodule.  PFT: 02/20/19 FVC 3.79 (92%) FEV1 2.32 (77%) Ratio 61  TLC 114% DLCO 123%. Significant bronchodilator response. Interpretation: Mild reversible obstructive defect present  Labs: CBC    Component Value Date/Time   WBC 6.6 07/03/2016 0849   RBC 4.88 07/03/2016 0849   HGB 14.5 07/03/2016 0849   HCT 41.8 07/03/2016 0849   PLT 218 07/03/2016 0849   MCV 85.7 07/03/2016 0849   MCH 29.7 07/03/2016 0849   MCHC 34.7 07/03/2016 0849   RDW 12.8 07/03/2016 0849   LYMPHSABS 1.1 07/03/2016 0849   MONOABS 0.5 07/03/2016 0849   EOSABS 1.0 (H) 07/03/2016 0849   BASOSABS 0.0 07/03/2016 0849   BMET    Component Value Date/Time   NA 139 07/03/2016 0849   K 3.8 07/03/2016 0849   CL 105 07/03/2016 0849   CO2 26 07/03/2016 0849   GLUCOSE 136 (H) 07/03/2016 0849   BUN 18 07/03/2016 0849    CREATININE 1.19 07/03/2016 0849   CALCIUM 9.2 07/03/2016 0849   GFRNONAA >60 07/03/2016 0849   GFRAA >60 07/03/2016 0849   Imaging, labs and test noted above have been reviewed independently by me.    Assessment & Plan:   Discussion:  14 year year old male with asthma-COPD overlap syndrome who presents for follow-up. Well-controlled on ICS/LABA however currently symptomatic after running out for >48 hours. Mild symptoms of bronchitis, not in acute exacerbation. Advised to take Symbicort 2 puffs BID until symptoms resolve and then he can resume his prior regimen.  COPD-Asthma Overlap Syndrome - symptomatic, worsening CONTINUE Symbicort 55mcg two puffs in the morning and one puff in the evening. REFILL  CONTINUE Albuterol as needed for shortness of breath or wheezing   Health Maintenance Immunization History  Administered Date(s) Administered  . Influenza Split 02/27/2016  .  Influenza, High Dose Seasonal PF 02/10/2017  . Influenza,inj,Quad PF,6+ Mos 02/11/2015  . Influenza-Unspecified 02/12/2012  . PFIZER(Purple Top)SARS-COV-2 Vaccination 06/12/2019, 07/03/2019  . Pneumococcal Conjugate-13 02/27/2016  . Pneumococcal-Unspecified 02/12/2012   CT Lung Screen - not qualified  No orders of the defined types were placed in this encounter.  Meds ordered this encounter  Medications  . budesonide-formoterol (SYMBICORT) 80-4.5 MCG/ACT inhaler    Sig: Inhale 2 puffs into the lungs in the morning and at bedtime.    Dispense:  10.2 each    Refill:  5   I have spent a total time of 32-minutes on the day of the appointment reviewing prior documentation, coordinating care and discussing medical diagnosis and plan with the patient/family. Imaging, labs and tests included in this note have been reviewed and interpreted independently by me.  No follow-ups on file.  Allegheny, MD [facility] 07/13/2020 3:34 PM  Office Number [phone]

## 2020-07-13 NOTE — Patient Instructions (Addendum)
COPD-Asthma Overlap Syndrome CONTINUE Symbicort 92mcg two puffs in the morning and one puff in the evening. REFILL CONTINUE Albuterol as needed for shortness of breath or wheezing  Follow-up in 6 months with me

## 2020-10-21 ENCOUNTER — Other Ambulatory Visit (HOSPITAL_BASED_OUTPATIENT_CLINIC_OR_DEPARTMENT_OTHER): Payer: Self-pay | Admitting: Home Modifications

## 2020-10-21 ENCOUNTER — Other Ambulatory Visit (HOSPITAL_COMMUNITY): Payer: Self-pay | Admitting: Home Modifications

## 2020-10-21 DIAGNOSIS — N50819 Testicular pain, unspecified: Secondary | ICD-10-CM

## 2020-10-21 DIAGNOSIS — R1032 Left lower quadrant pain: Secondary | ICD-10-CM

## 2020-10-21 DIAGNOSIS — R319 Hematuria, unspecified: Secondary | ICD-10-CM

## 2020-10-22 ENCOUNTER — Ambulatory Visit (HOSPITAL_BASED_OUTPATIENT_CLINIC_OR_DEPARTMENT_OTHER)
Admission: RE | Admit: 2020-10-22 | Discharge: 2020-10-22 | Disposition: A | Discharge status: Home / Self Care | Payer: Medicare Other | Source: Ambulatory Visit | Attending: Home Modifications | Admitting: Home Modifications

## 2020-10-22 ENCOUNTER — Other Ambulatory Visit: Payer: Self-pay

## 2020-10-22 DIAGNOSIS — N50819 Testicular pain, unspecified: Secondary | ICD-10-CM | POA: Insufficient documentation

## 2020-10-22 DIAGNOSIS — R1032 Left lower quadrant pain: Secondary | ICD-10-CM

## 2020-10-22 DIAGNOSIS — R319 Hematuria, unspecified: Secondary | ICD-10-CM | POA: Insufficient documentation

## 2020-12-31 DIAGNOSIS — R1032 Left lower quadrant pain: Secondary | ICD-10-CM | POA: Diagnosis not present

## 2020-12-31 DIAGNOSIS — R1012 Left upper quadrant pain: Secondary | ICD-10-CM | POA: Diagnosis not present

## 2020-12-31 DIAGNOSIS — R52 Pain, unspecified: Secondary | ICD-10-CM | POA: Diagnosis not present

## 2021-01-04 ENCOUNTER — Other Ambulatory Visit: Payer: Self-pay | Admitting: Home Modifications

## 2021-01-04 DIAGNOSIS — R1032 Left lower quadrant pain: Secondary | ICD-10-CM

## 2021-01-24 ENCOUNTER — Ambulatory Visit: Payer: Medicare Other | Admitting: Pulmonary Disease

## 2021-01-24 ENCOUNTER — Encounter: Payer: Self-pay | Admitting: Pulmonary Disease

## 2021-01-24 ENCOUNTER — Other Ambulatory Visit: Payer: Self-pay

## 2021-01-24 VITALS — BP 118/72 | HR 69 | Temp 97.8°F | Ht 68.0 in | Wt 205.0 lb

## 2021-01-24 DIAGNOSIS — Z23 Encounter for immunization: Secondary | ICD-10-CM | POA: Diagnosis not present

## 2021-01-24 DIAGNOSIS — J449 Chronic obstructive pulmonary disease, unspecified: Secondary | ICD-10-CM | POA: Diagnosis not present

## 2021-01-24 MED ORDER — ALBUTEROL SULFATE HFA 108 (90 BASE) MCG/ACT IN AERS: 2.0000 | INHALATION_SPRAY | Freq: Four times a day (QID) | RESPIRATORY_TRACT | 5 refills | Status: DC | PRN

## 2021-01-24 MED ORDER — BUDESONIDE-FORMOTEROL FUMARATE 160-4.5 MCG/ACT IN AERO: 2.0000 | INHALATION_SPRAY | Freq: Every day | RESPIRATORY_TRACT | 5 refills | Status: DC

## 2021-01-24 NOTE — Progress Notes (Signed)
Subjective:   PATIENT ID: Marcie Mowers GENDER: male DOB: 1945-10-19, MRN: [id number]   HPI  Chief Complaint  Patient presents with   Follow-up    COPD Discuss covid booster and flu vaccine    Reason for Visit: Follow-up   [name] is 75 year old male former smoker with asthma-COPD overlap syndrome who presents for follow-up.  Synopsis: He is a former Dr. Lake Bells patient. Established care with me in 2020. He has been on Trelegy, Serevent, Stiolto Respimat and Symbicort. He did not tolerate Trelegy due to right sided throat irritation. Currently he is compliant on Symbicort with spacer. His asthma typically is triggered seasonally in the fall and spring and with illness. He is active and is able to play 16 rounds of gold last week.   Since our last visit six months ago, he has been compliant with Symbicort and rarely uses his rescue inhaler. However he ran out over the weekend. Began having symptoms of mild cough, wheezing, chest tightness and shortness of breath. Denies fevers, chills. Despite these symptoms his activity has not been limited and is planning to go play golf. He knows his symptoms will resolve once he restarts his Symbicort.  01/24/21 Overall he reports he is doing well and feels his asthma-COPD symptoms are well-controlled on Symbicort. Compliant with his inhalers. Rarely uses albuterol. He is golfing regularly. He does feel his chest tightness gradually worsening since our last visit. Recently went to a music festival this weekend and tried to dance but only lasted a few months.  Social History: Former smoker. Quit in 1995. Smokes MJ 1-2x a month. Now taking CBD   Past Medical History:  Diagnosis Date   Asthma    COPD (chronic obstructive pulmonary disease) ([facility])    DJD (degenerative joint disease)    of back   Prostate cancer ([facility])    S/P radiation therapy 08/12/2014 through 09/25/2014                                                       Prostate bed 6600 cGy in 33 sessions                         Shortness of breath    with exertion     Allergies  Allergen Reactions   Penicillins Other (See Comments)    "Negative feeling" Has patient had a PCN reaction causing immediate rash, facial/tongue/throat swelling, SOB or lightheadedness with hypotension: No Has patient had a PCN reaction causing severe rash involving mucus membranes or skin necrosis: No Has patient had a PCN reaction that required hospitalization: No Has patient had a PCN reaction occurring within the last 10 years: No If all of the above answers are "NO", then may proceed with Cephalosporin use.     Outpatient Medications Prior to Visit  Medication Sig Dispense Refill   albuterol (VENTOLIN HFA) 108 (90 Base) MCG/ACT inhaler Inhale 2 puffs into the lungs every 6 (six) hours as needed. For shortness of breath 18 g 5   budesonide-formoterol (SYMBICORT) 80-4.5 MCG/ACT inhaler Inhale 2 puffs into the lungs in the morning and at bedtime. 10.2 each 5   aspirin 325 MG tablet Take 325 mg by mouth daily.     HYDROcodone-acetaminophen (NORCO) 5-325 MG tablet Take 1  tablet by mouth every 4 (four) hours as needed for moderate pain. 8 tablet 0   No facility-administered medications prior to visit.    Review of Systems  Constitutional:  Negative for chills, diaphoresis, fever, malaise/fatigue and weight loss.  HENT:  Negative for congestion.   Respiratory:  Negative for cough, hemoptysis, sputum production, shortness of breath and wheezing.   Cardiovascular:  Negative for chest pain (chest tightness), palpitations and leg swelling.    Objective:   Vitals:   01/24/21 1106  BP: 118/72  Pulse: 69  Temp: 97.8 F (36.6 C)  TempSrc: Oral  SpO2: 99%  Weight: 205 lb (93 kg)  Height: '5\' 8"'$  (1.727 m)   SpO2: 99 % O2 Device: None (Room air)  Physical Exam: General: Well-appearing, no acute distress HENT: [place], AT Eyes: EOMI, no scleral icterus Respiratory: Clear  to auscultation bilaterally.  No crackles, wheezing or rales Cardiovascular: RRR, -M/R/G, no JVD Extremities:-Edema,-tenderness Neuro: AAO x4, CNII-XII grossly intact Psych: Normal mood, normal affect  Data Reviewed:  Imaging: CT Chest 03/21/16 - Interval resolution of LLL lung nodule and decreased size of LUL lung nodule.  PFT: 02/20/19 FVC 3.79 (92%) FEV1 2.32 (77%) Ratio 61  TLC 114% DLCO 123%. Significant bronchodilator response. Interpretation: Mild reversible obstructive defect present     Assessment & Plan:   Discussion: 75 year old male with asthma-COPD overlap syndrome who presents for follow-up. No recent exacerbations however he reports worsening symptoms including chest tightness.  COPD-Asthma Overlap Syndrome - no exacerbations, increased symptoms INCREASE Symbicort 160 mcg TWOL puffs TWICE a day. REFILL  CONTINUE Albuterol as needed for shortness of breath or wheezing. REFILL ADMINISTER influenza vaccine today Discussed covid vaccination  Health Maintenance Immunization History  Administered Date(s) Administered   Influenza Split 02/27/2016   Influenza, High Dose Seasonal PF 02/10/2017   Influenza,inj,Quad PF,6+ Mos 02/11/2015   Influenza-Unspecified 02/12/2012   PFIZER(Purple Top)SARS-COV-2 Vaccination 06/12/2019, 07/03/2019   Pneumococcal Conjugate-13 02/27/2016   Pneumococcal-Unspecified 02/12/2012   CT Lung Screen - not qualified  Orders Placed This Encounter  Procedures   Flu Vaccine QUAD High Dose(Fluad)    Meds ordered this encounter  Medications   budesonide-formoterol (SYMBICORT) 160-4.5 MCG/ACT inhaler    Sig: Inhale 2 puffs into the lungs daily.    Dispense:  1 each    Refill:  5   albuterol (VENTOLIN HFA) 108 (90 Base) MCG/ACT inhaler    Sig: Inhale 2 puffs into the lungs every 6 (six) hours as needed. For shortness of breath    Dispense:  18 g    Refill:  5    Return in about 6 months (around 07/24/2021).  Zihlman,  MD [facility] 01/24/2021 11:09 AM  Office Number [phone]

## 2021-01-24 NOTE — Patient Instructions (Signed)
COPD-Asthma Overlap Syndrome - no exacerbations, increased symptoms INCREASE Symbicort 160 mcg TWOL puffs TWICE a day. REFILL  CONTINUE Albuterol as needed for shortness of breath or wheezing. REFILL ADMINISTER influenza vaccine today  Follow-up with me in 6 months

## 2021-01-31 ENCOUNTER — Ambulatory Visit
Admission: RE | Admit: 2021-01-31 | Discharge: 2021-01-31 | Disposition: A | Discharge status: Home / Self Care | Payer: Medicare Other | Source: Ambulatory Visit | Attending: Home Modifications | Admitting: Home Modifications

## 2021-01-31 DIAGNOSIS — K4020 Bilateral inguinal hernia, without obstruction or gangrene, not specified as recurrent: Secondary | ICD-10-CM | POA: Diagnosis not present

## 2021-01-31 DIAGNOSIS — K760 Fatty (change of) liver, not elsewhere classified: Secondary | ICD-10-CM | POA: Diagnosis not present

## 2021-01-31 DIAGNOSIS — M47816 Spondylosis without myelopathy or radiculopathy, lumbar region: Secondary | ICD-10-CM | POA: Diagnosis not present

## 2021-01-31 DIAGNOSIS — K5730 Diverticulosis of large intestine without perforation or abscess without bleeding: Secondary | ICD-10-CM | POA: Diagnosis not present

## 2021-01-31 DIAGNOSIS — R1032 Left lower quadrant pain: Secondary | ICD-10-CM

## 2021-01-31 MED ORDER — IOPAMIDOL (ISOVUE-300) INJECTION 61%
100.0000 mL | Freq: Once | INTRAVENOUS | Status: AC | PRN
  Administered 2021-01-31: 100 mL via INTRAVENOUS

## 2021-03-04 ENCOUNTER — Other Ambulatory Visit: Payer: Self-pay | Admitting: Pulmonary Disease

## 2021-06-03 DIAGNOSIS — M25552 Pain in left hip: Secondary | ICD-10-CM | POA: Diagnosis not present

## 2021-06-03 DIAGNOSIS — R1030 Lower abdominal pain, unspecified: Secondary | ICD-10-CM | POA: Diagnosis not present

## 2021-06-22 IMAGING — US US PELVIS LIMITED
1 series · 14 of 15 positions shown · non-contrast
Comparison: Abdominopelvic CT from 01/15/2019

CLINICAL DATA: Left inguinal pain which radiates into the left
testicle. Prostatectomy 9 years ago.

EXAM:
LIMITED ULTRASOUND OF PELVIS
TECHNIQUE: Limited transabdominal ultrasound examination of the pelvis was
performed.

[Series 1: us pelvis limited (transabdominal only) · 15 acquisitions, 14 frames shown]
[im 1/15]
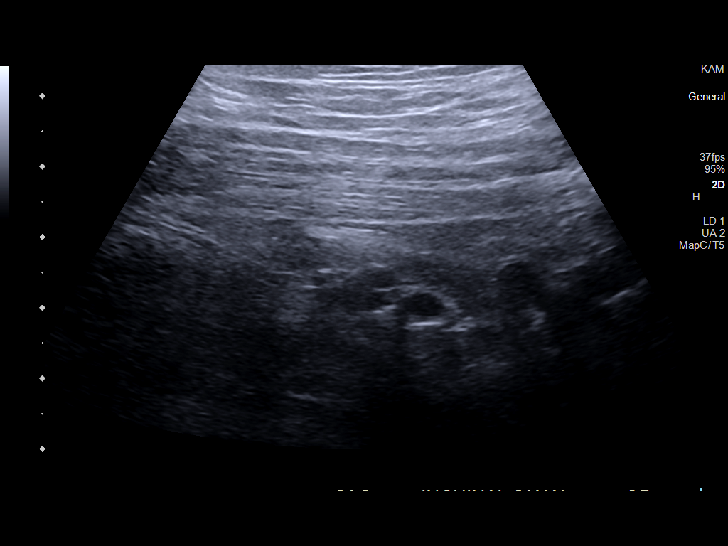
[im 2/15]
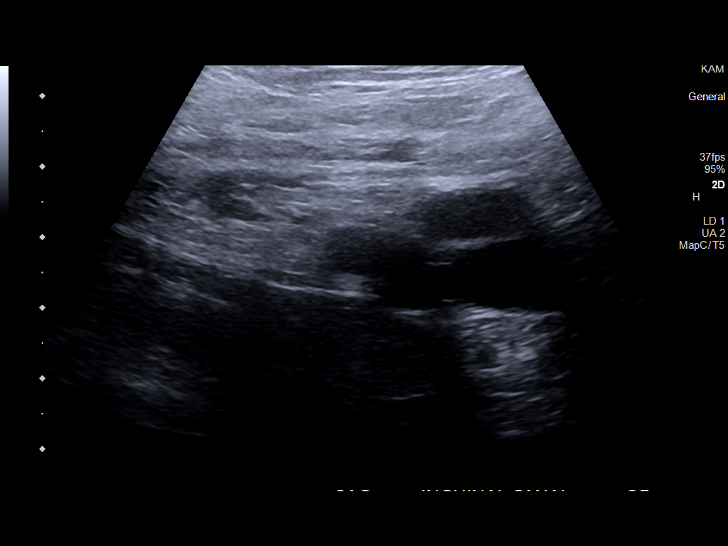
[im 3/15]
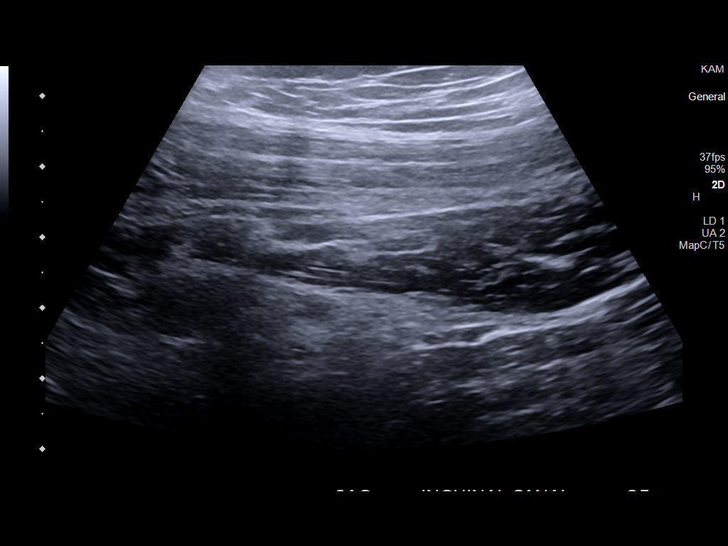
[im 4/15]
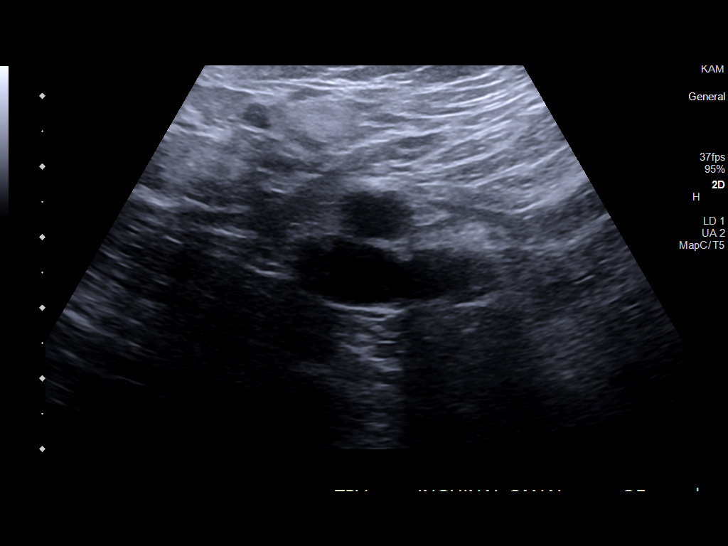
[im 5/15]
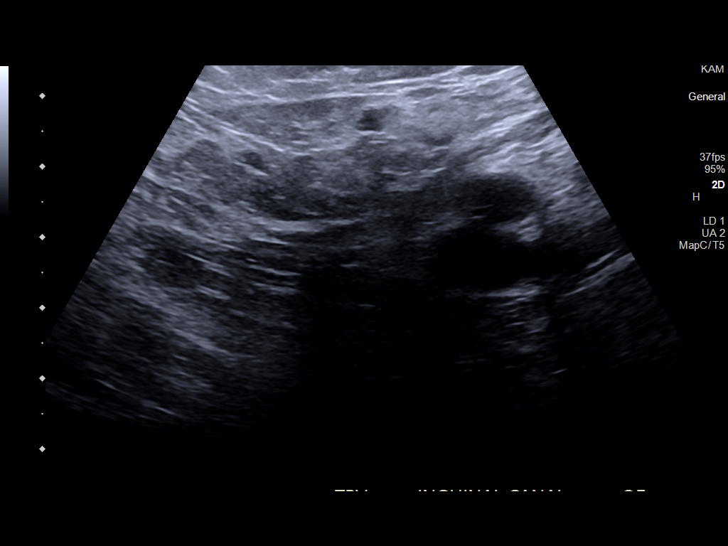
[im 6/15]
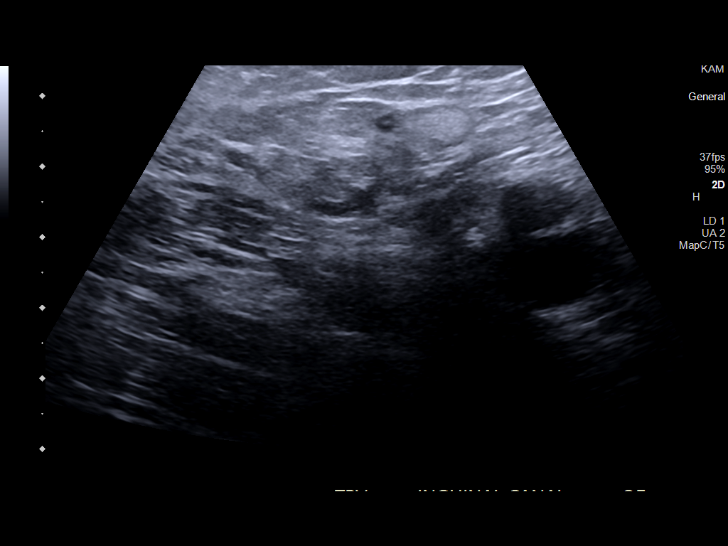
[im 7/15]
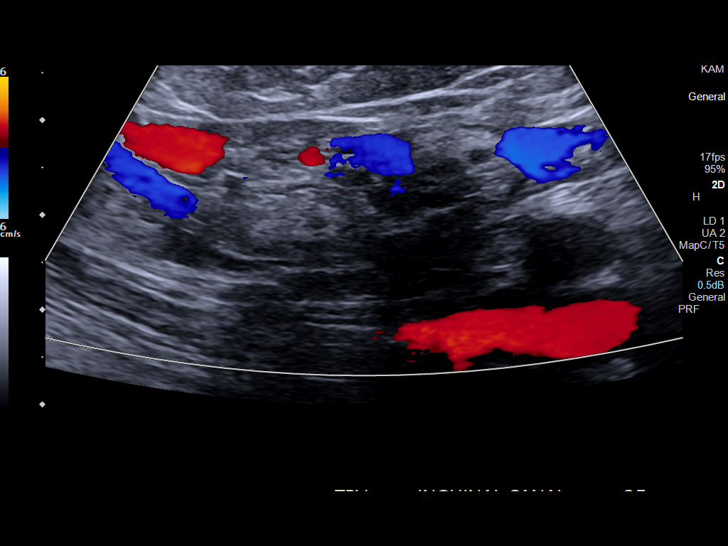
[im 9/15]
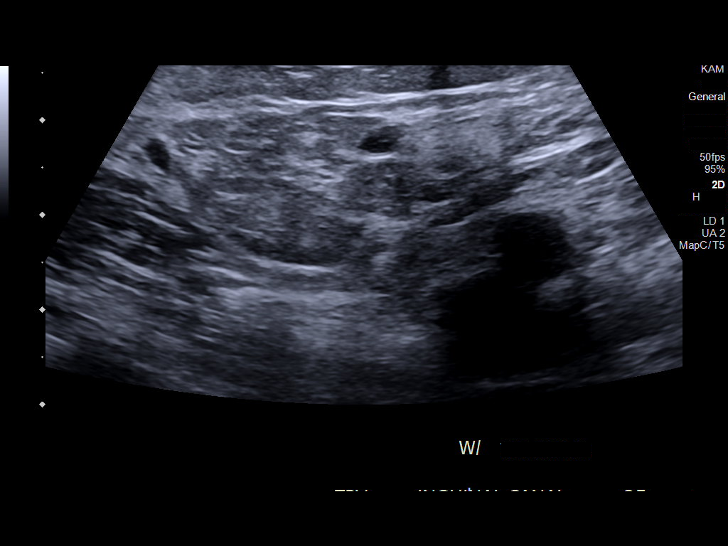
[im 10/15]
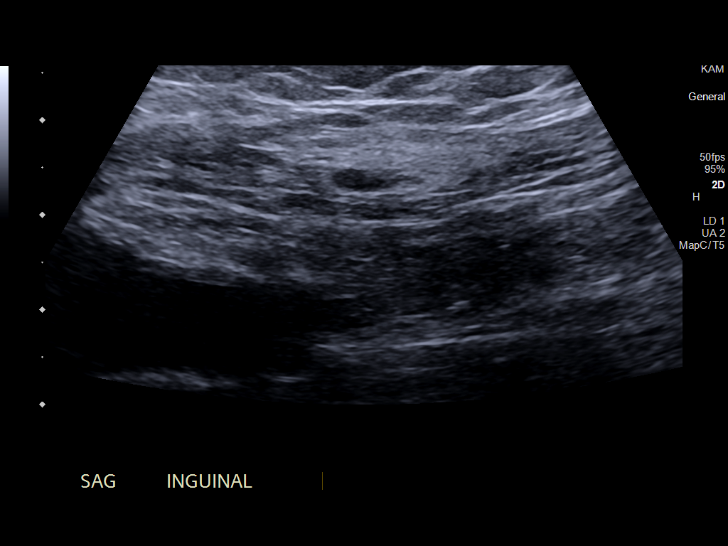
[im 11/15]
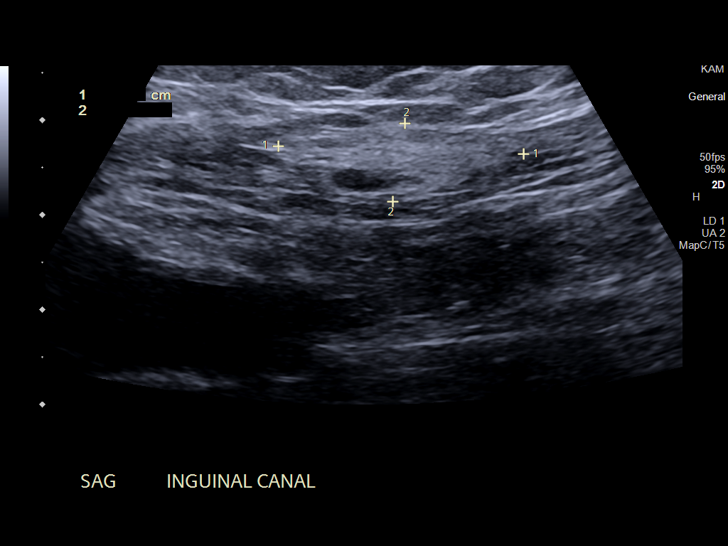
[im 12/15]
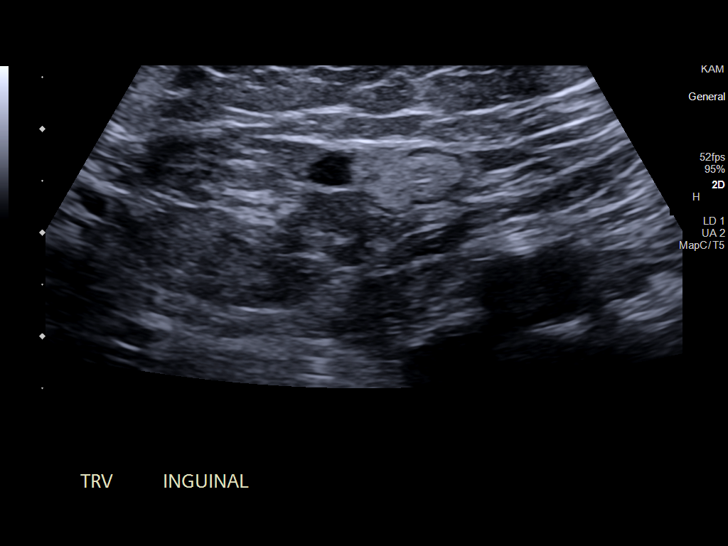
[im 13/15]
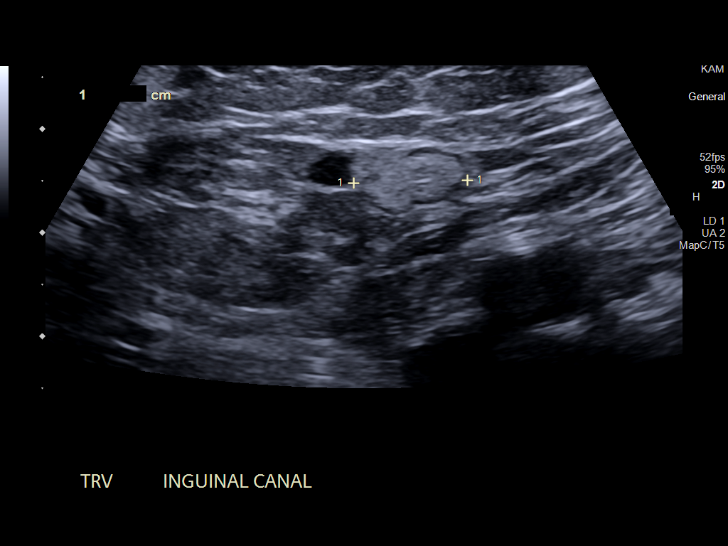
[im 14/15]
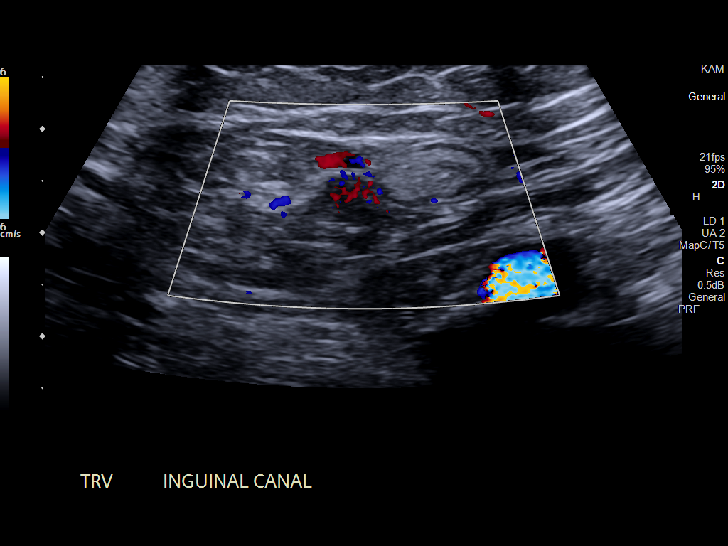
[im 15/15]
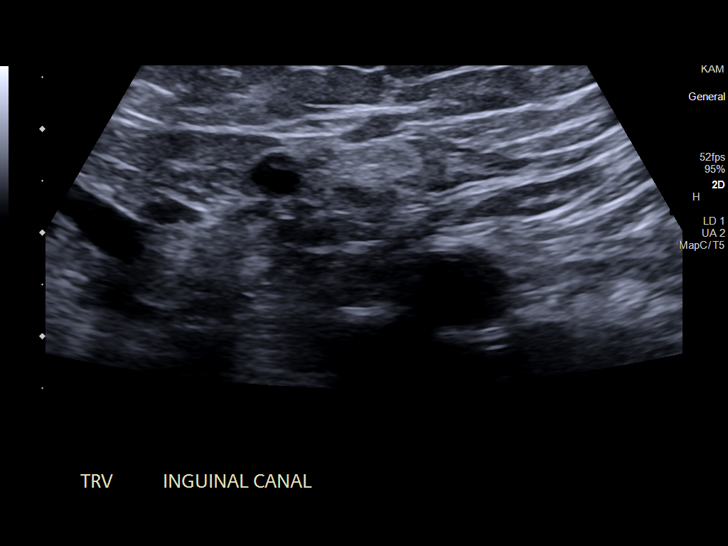

[14 of 15 positions shown; findings below may reference images not displayed]

FINDINGS: Multiplanar grayscale ultrasound of the left inguinal canal
demonstrates no evidence of hernia. Presumed lymph nodes within this
region of up to the 8 mm short axis are presumably reactive.
IMPRESSION: No ultrasound evidence of hernia. If this is an ongoing clinical
concern, the test of choice is pelvic CT.

## 2021-06-22 IMAGING — US US SCROTUM W/ DOPPLER COMPLETE
1 series · 14 of 25 positions shown · non-contrast
Comparison: 01/15/2019 abdominopelvic CT.

CLINICAL DATA: Left testicular pain and groin pain for 3 years.
History of prostatectomy 9 years ago.

EXAM:
SCROTAL ULTRASOUND
DOPPLER ULTRASOUND OF THE TESTICLES
TECHNIQUE: Complete ultrasound examination of the testicles, epididymis, and
other scrotal structures was performed. Color and spectral Doppler
ultrasound were also utilized to evaluate blood flow to the
testicles.

[Series 1: us scrotum w/doppler · 14 of 90 slices shown]
[im 1/90]
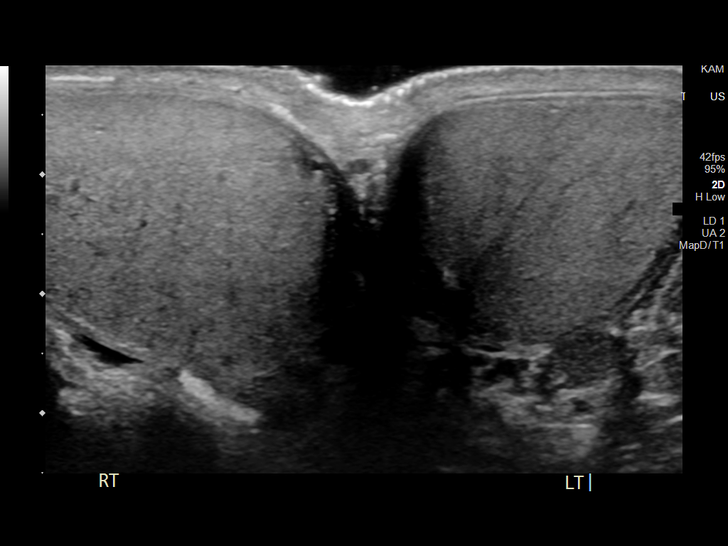
[im 8/90]
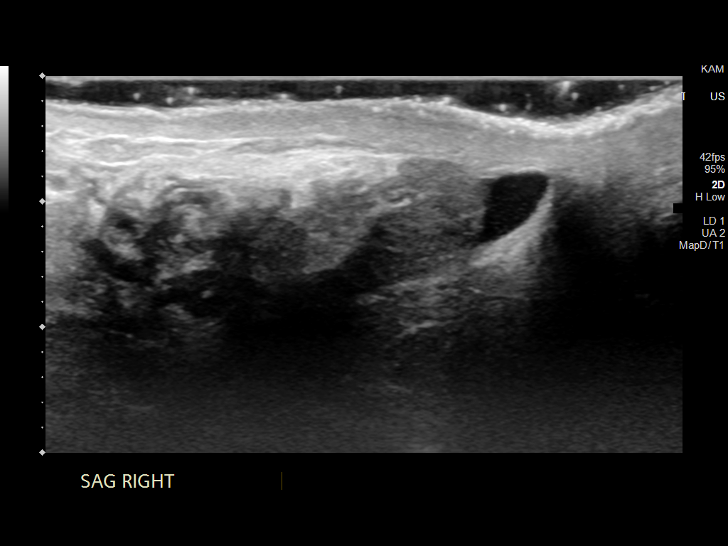
[im 15/90]
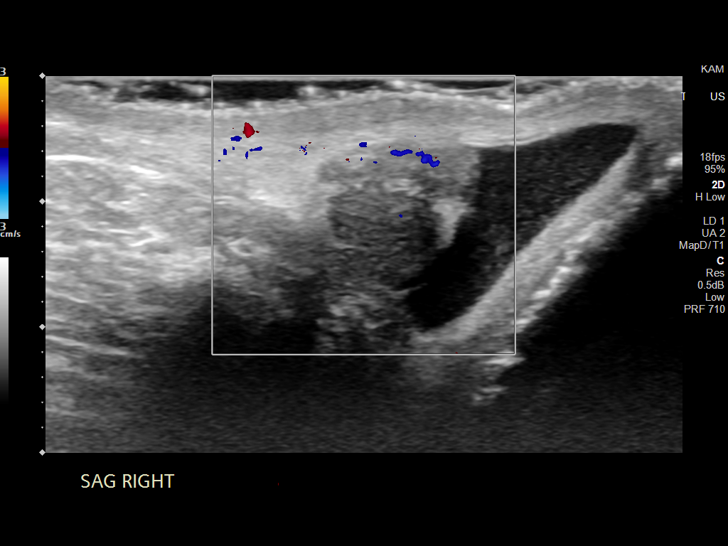
[im 23/90]
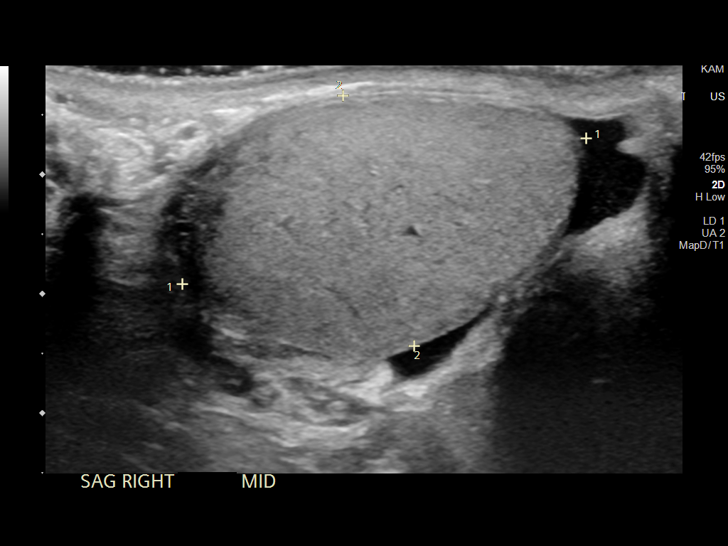
[im 30/90]
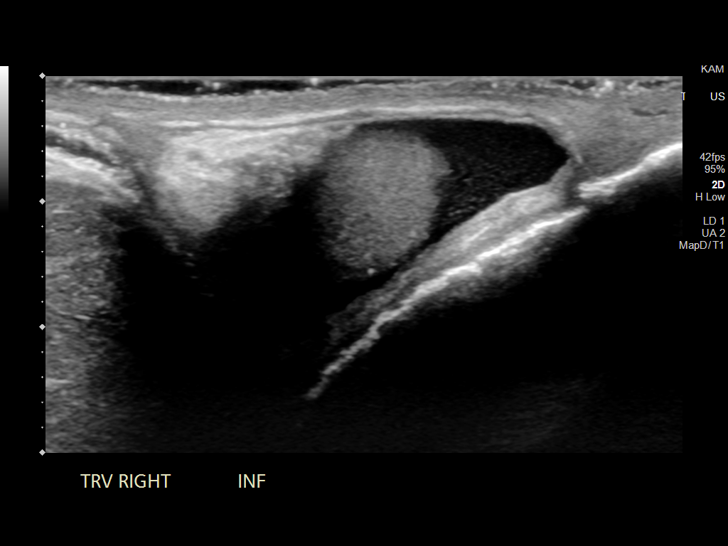
[im 34/90]
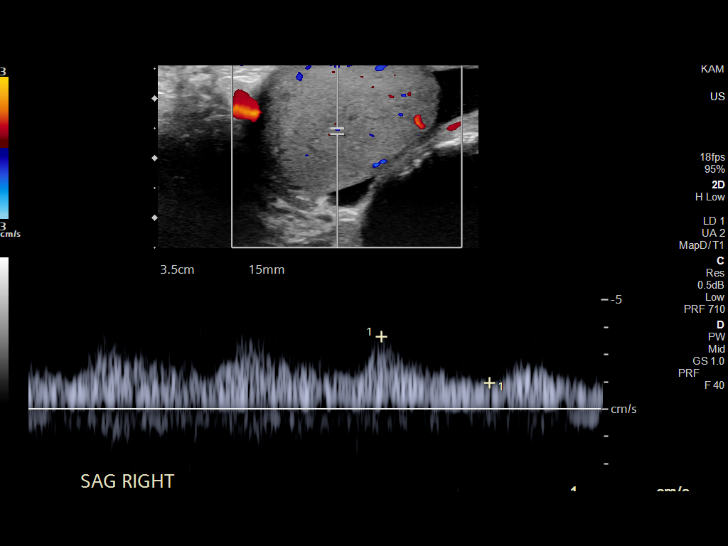
[im 41/90]
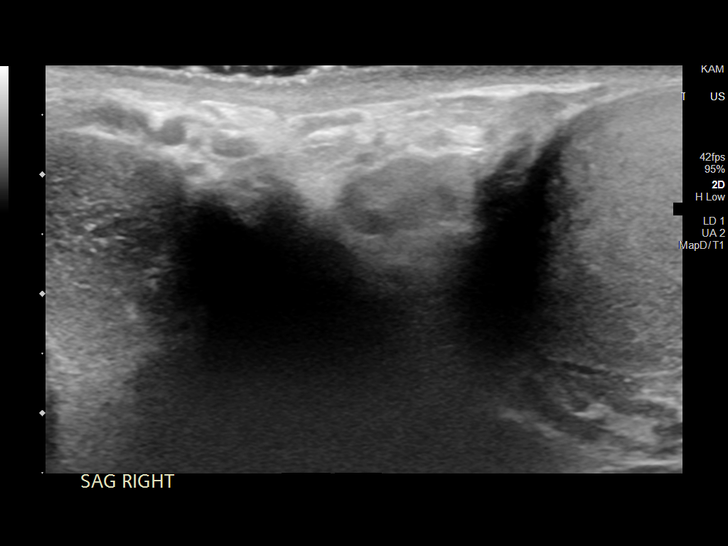
[im 49/90]
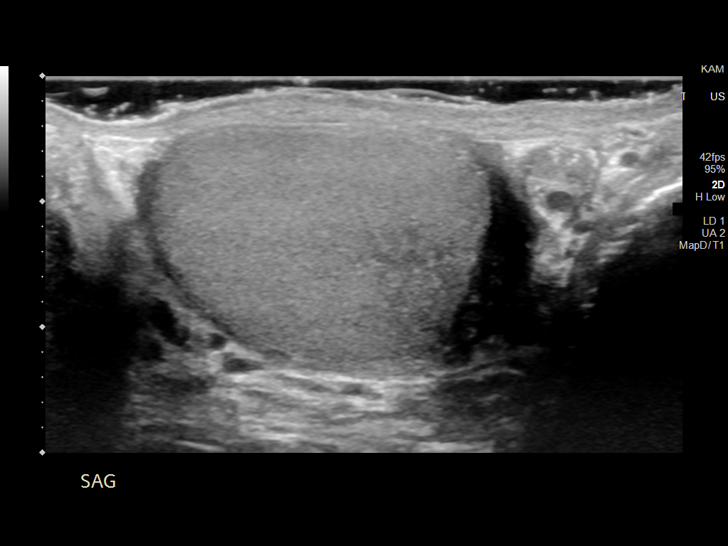
[im 56/90]
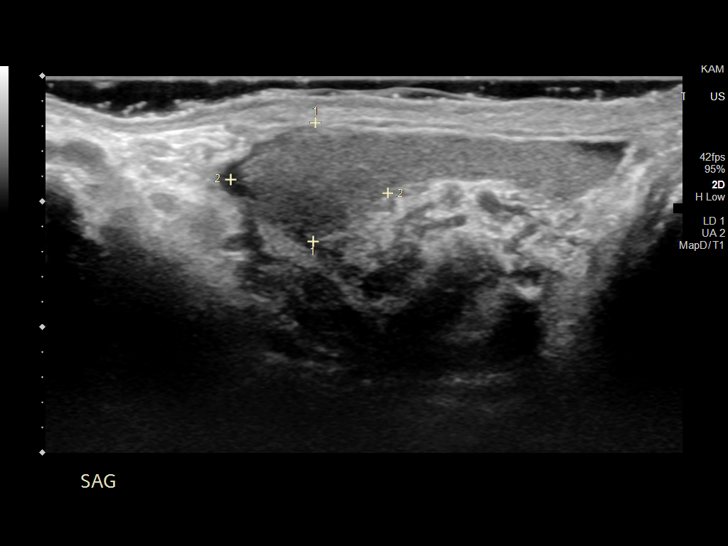
[im 60/90]
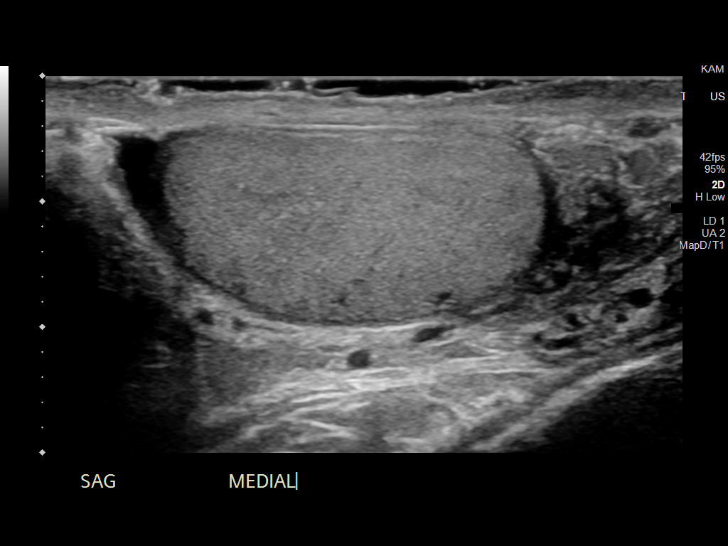
[im 67/90]
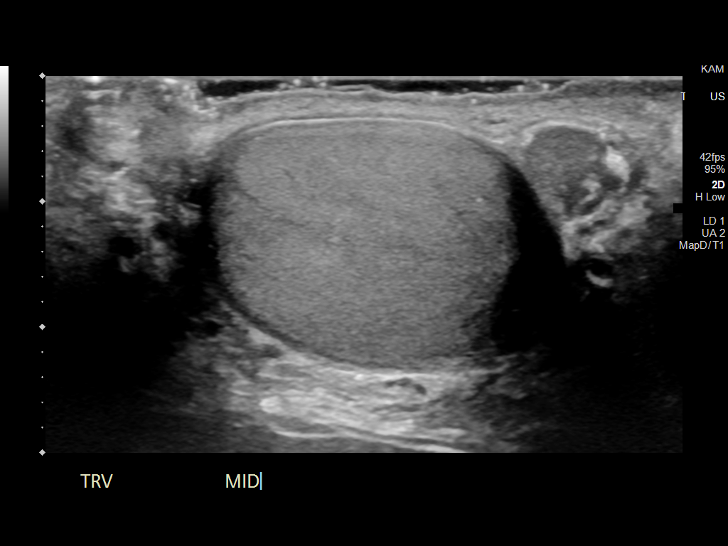
[im 75/90]
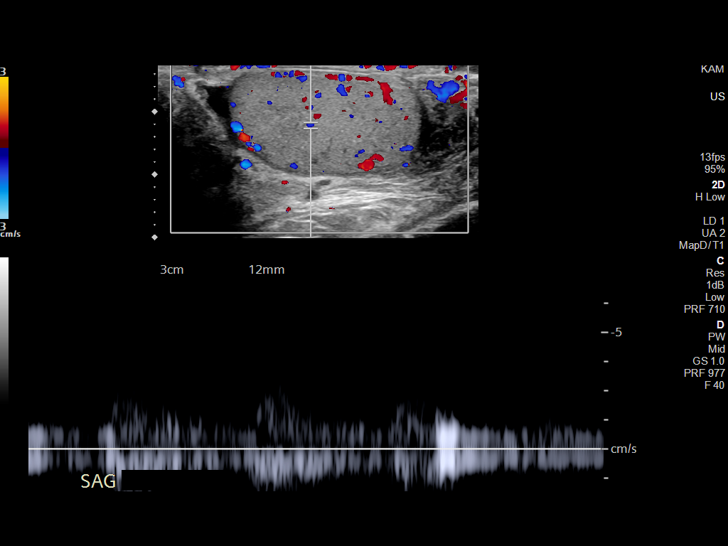
[im 82/90]
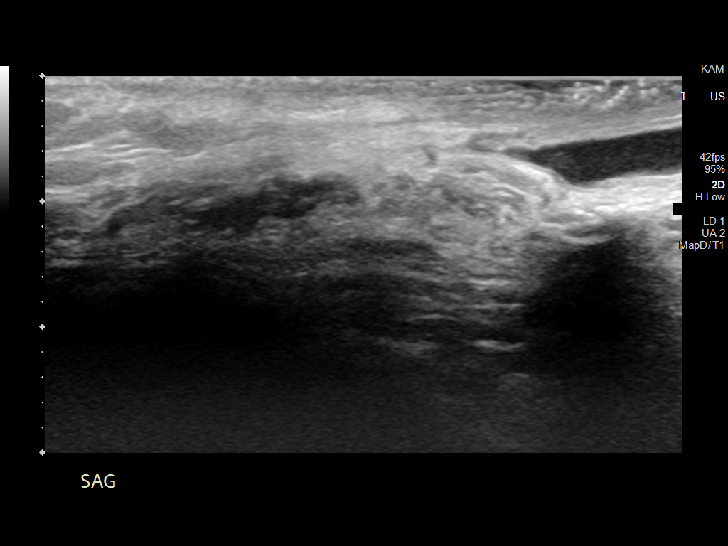
[im 90/90]
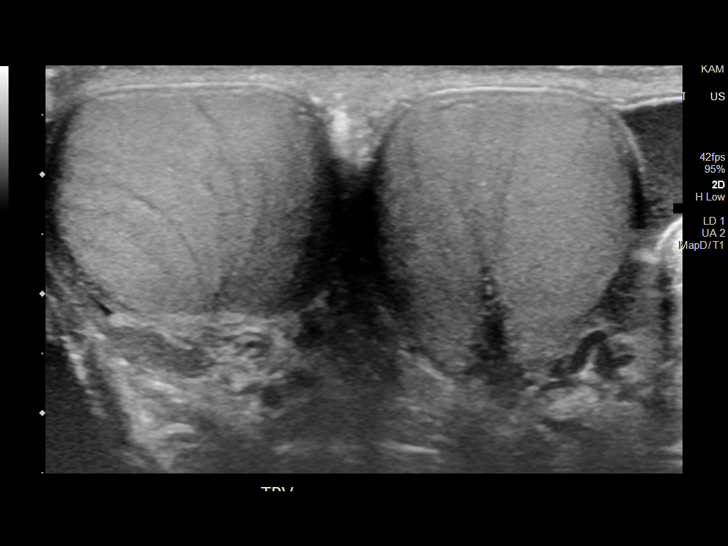

[14 of 25 positions shown; findings below may reference images not displayed]

FINDINGS: Right testicle

Measurements: 3.6 x 2.2 x 2.2 cm. No mass or microlithiasis
visualized.

Left testicle

Measurements: 3.4 x 1.9 x 2.5 cm. No mass or microlithiasis
visualized.

Right epididymis:  Normal in size and appearance.

Left epididymis:  Normal in size and appearance.

Hydrocele:  Small bilateral hydroceles.

Varicocele:  None visualized.

Pulsed Doppler interrogation of both testes demonstrates normal low
resistance arterial and venous waveforms bilaterally. Symmetric
color Doppler signal bilaterally.
IMPRESSION: 1. No evidence of testicular torsion. No explanation for left-sided
pain.
2. Small bilateral hydroceles.

## 2021-11-23 DIAGNOSIS — R319 Hematuria, unspecified: Secondary | ICD-10-CM | POA: Diagnosis not present

## 2022-01-19 ENCOUNTER — Other Ambulatory Visit: Payer: Self-pay | Admitting: Pulmonary Disease

## 2022-01-19 ENCOUNTER — Telehealth: Payer: Self-pay | Admitting: Pulmonary Disease

## 2022-01-19 NOTE — Telephone Encounter (Signed)
Patient needs refill on Symbicort before his appointment on 9/21 sent to [facility] on [address].   Please advise.

## 2022-01-20 MED ORDER — BUDESONIDE-FORMOTEROL FUMARATE 160-4.5 MCG/ACT IN AERO: 2.0000 | INHALATION_SPRAY | Freq: Two times a day (BID) | RESPIRATORY_TRACT | 0 refills | Status: DC

## 2022-01-20 NOTE — Telephone Encounter (Signed)
Notified pt that Symbicort refill was sent in. Nothing further needed at this time.

## 2022-02-02 ENCOUNTER — Ambulatory Visit: Payer: Medicare Other | Admitting: Pulmonary Disease

## 2022-02-02 ENCOUNTER — Encounter: Payer: Self-pay | Admitting: Pulmonary Disease

## 2022-02-02 VITALS — BP 110/70 | HR 64 | Ht 68.5 in | Wt 205.4 lb

## 2022-02-02 DIAGNOSIS — Z23 Encounter for immunization: Secondary | ICD-10-CM

## 2022-02-02 DIAGNOSIS — J449 Chronic obstructive pulmonary disease, unspecified: Secondary | ICD-10-CM

## 2022-02-02 MED ORDER — BUDESONIDE-FORMOTEROL FUMARATE 160-4.5 MCG/ACT IN AERO: 2.0000 | INHALATION_SPRAY | Freq: Two times a day (BID) | RESPIRATORY_TRACT | 11 refills | Status: DC

## 2022-02-02 NOTE — Patient Instructions (Addendum)
COPD-Asthma Overlap Syndrome  CONTINUE Symbicort 160 mcg TWO puffs TWICE a day. REFILL  CONTINUE Albuterol as needed for shortness of breath or wheezing. REFILL Discussed vaccinations including influenza, RSV, covid and pneumococcal.  Administer influenza and PCV-20 today    Follow-up with Dr. Lake Bells in 6 months

## 2022-02-02 NOTE — Progress Notes (Signed)
Subjective:   PATIENT ID: Marcie Mowers GENDER: male DOB: 1945/10/31, MRN: [id number]   HPI  Chief Complaint  Patient presents with   Follow-up    Lost wife 18 months ago    Reason for Visit: Follow-up   Mr. Leticia Mcdiarmid is 76 year old male former smoker with asthma-COPD overlap syndrome who presents for follow-up.  Synopsis: He is a former Dr. Lake Bells patient. Established care with me in 2020. He has been on Trelegy, Serevent, Stiolto Respimat and Symbicort. He did not tolerate Trelegy due to right sided throat irritation. Currently he is compliant on Symbicort with spacer. His asthma typically is triggered seasonally in the fall and spring and with illness. He is active and is able to play 16 rounds of golf at baseline.  02/02/22 Currently on Symbicort and occasionally has chest tightness and maybe some wheezing. Denies cough. Shortness of breath with moderate/heavy activity (dancing). Does not limit regular activity. HUses symbicort 1-2 puffs twice a day depending on how he feels. Currently able to play 18 holes of golf twice a month. Not exercising daily but tries to stay socially actively since his wife's death. Been using CBD.  Prior inhalers: Trelegy - not feeling well on this Advair - not as effective as Symbicort  Social History: Former smoker. Quit in 1995. Smokes MJ 1-2x a month. Now taking CBD   Past Medical History:  Diagnosis Date   Asthma    COPD (chronic obstructive pulmonary disease) (HCC)    DJD (degenerative joint disease)    of back   Prostate cancer (HCC)    S/P radiation therapy 08/12/2014 through 09/25/2014                                                      Prostate bed 6600 cGy in 33 sessions                         Shortness of breath    with exertion     Allergies  Allergen Reactions   Penicillins Other (See Comments)    "Negative feeling" Has patient had a PCN reaction causing immediate rash, facial/tongue/throat swelling, SOB or  lightheadedness with hypotension: No Has patient had a PCN reaction causing severe rash involving mucus membranes or skin necrosis: No Has patient had a PCN reaction that required hospitalization: No Has patient had a PCN reaction occurring within the last 10 years: No If all of the above answers are "NO", then may proceed with Cephalosporin use.     Outpatient Medications Prior to Visit  Medication Sig Dispense Refill   albuterol (VENTOLIN HFA) 108 (90 Base) MCG/ACT inhaler Inhale 2 puffs into the lungs every 6 (six) hours as needed. For shortness of breath 18 g 5   budesonide-formoterol (SYMBICORT) 160-4.5 MCG/ACT inhaler Inhale 2 puffs into the lungs 2 (two) times daily. 10.2 each 0   No facility-administered medications prior to visit.    Review of Systems  Constitutional:  Negative for chills, diaphoresis, fever, malaise/fatigue and weight loss.  HENT:  Negative for congestion.   Respiratory:  Positive for shortness of breath and wheezing. Negative for cough, hemoptysis and sputum production.   Cardiovascular:  Negative for chest pain (chest tightness), palpitations and leg swelling.     Objective:   Vitals:  02/02/22 0949  BP: 110/70  Pulse: 64  SpO2: 94%  Weight: 205 lb 6.4 oz (93.2 kg)  Height: 5' 8.5" (1.74 m)   SpO2: 94 % O2 Device: None (Room air)  Physical Exam: General: Well-appearing, no acute distress HENT: [place], AT Eyes: EOMI, no scleral icterus Respiratory: Clear to auscultation bilaterally.  No crackles, wheezing or rales Cardiovascular: RRR, -M/R/G, no JVD Extremities:-Edema,-tenderness Neuro: AAO x4, CNII-XII grossly intact Psych: Normal mood, normal affect  Data Reviewed:  Imaging: CT Chest 03/21/16 - Interval resolution of LLL lung nodule and decreased size of LUL lung nodule.  PFT: 02/20/19 FVC 3.79 (92%) FEV1 2.32 (77%) Ratio 61  TLC 114% DLCO 123%. Significant bronchodilator response. Interpretation: Mild reversible obstructive defect  present     Assessment & Plan:   Discussion: 76 year old male with asthma-COPD overlap syndrome who presents for follow-up. Last exacerbation in May 2023 and went to urgent care. Currently better controlled with some mild symptoms however not adherent to inhaler as prescribed. Discussed clinical course and management of COPD/asthma including bronchodilator regimen compliance and action plan for exacerbation.  COPD-Asthma Overlap Syndrome  CONTINUE Symbicort 160 mcg TWO puffs TWICE a day. REFILL  CONTINUE Albuterol as needed for shortness of breath or wheezing. REFILL Discussed vaccinations including influenza, RSV, covid and pneumococcal.  Administer influenza and PCV-20 today   Patient previously seen by Dr. Lake Bells and requesting to return to him   Health Maintenance Immunization History  Administered Date(s) Administered   Fluad Quad(high Dose 65+) 01/24/2021   Influenza Split 02/27/2016   Influenza, High Dose Seasonal PF 02/10/2017   Influenza,inj,Quad PF,6+ Mos 02/11/2015   Influenza-Unspecified 02/12/2012   Moderna Covid-19 Vaccine Bivalent Booster 71yr & up 02/24/2021   PFIZER(Purple Top)SARS-COV-2 Vaccination 06/12/2019, 07/03/2019   Pneumococcal Conjugate-13 02/27/2016   Pneumococcal-Unspecified 02/12/2012   CT Lung Screen - not qualified  Orders Placed This Encounter  Procedures   Flu Vaccine QUAD High Dose(Fluad)   Meds ordered this encounter  Medications   budesonide-formoterol (SYMBICORT) 160-4.5 MCG/ACT inhaler    Sig: Inhale 2 puffs into the lungs 2 (two) times daily.    Dispense:  10.2 each    Refill:  11   Return in about 6 months (around 08/03/2022).  CToston MD [facility] 02/02/2022 10:01 AM  Office Number [phone]

## 2022-05-30 DIAGNOSIS — E041 Nontoxic single thyroid nodule: Secondary | ICD-10-CM | POA: Diagnosis not present

## 2022-05-30 DIAGNOSIS — J449 Chronic obstructive pulmonary disease, unspecified: Secondary | ICD-10-CM | POA: Diagnosis not present

## 2022-05-30 DIAGNOSIS — I700 Atherosclerosis of aorta: Secondary | ICD-10-CM | POA: Diagnosis not present

## 2022-05-30 DIAGNOSIS — Z0000 Encounter for general adult medical examination without abnormal findings: Secondary | ICD-10-CM | POA: Diagnosis not present

## 2022-05-31 ENCOUNTER — Other Ambulatory Visit: Payer: Self-pay | Admitting: Family Medicine

## 2022-05-31 DIAGNOSIS — E041 Nontoxic single thyroid nodule: Secondary | ICD-10-CM

## 2022-06-02 ENCOUNTER — Inpatient Hospital Stay: Admission: RE | Admit: 2022-06-02 | Payer: Medicare Other | Source: Ambulatory Visit

## 2022-06-14 ENCOUNTER — Ambulatory Visit
Admission: RE | Admit: 2022-06-14 | Discharge: 2022-06-14 | Disposition: A | Discharge status: Home / Self Care | Payer: Medicare Other | Source: Ambulatory Visit | Attending: Family Medicine | Admitting: Family Medicine

## 2022-06-14 DIAGNOSIS — E041 Nontoxic single thyroid nodule: Secondary | ICD-10-CM

## 2022-06-16 ENCOUNTER — Other Ambulatory Visit: Payer: Self-pay | Admitting: Family Medicine

## 2022-06-16 DIAGNOSIS — E041 Nontoxic single thyroid nodule: Secondary | ICD-10-CM

## 2022-06-27 ENCOUNTER — Telehealth: Payer: Self-pay | Admitting: Pulmonary Disease

## 2022-06-27 NOTE — Telephone Encounter (Signed)
Patient called to request to switch from Dr. Loanne Drilling to Dr. Lake Bells.  He stated that he spoke to doctor Loanne Drilling about this switch last year.  Please advise patient if this is confirmed so he can be scheduled for an appt. With Dr. Lake Bells.    Also patient would like a refill for his medication, Albuterol.  He would like it sent to the [facility] on [address].  Pleae advise.

## 2022-06-28 MED ORDER — ALBUTEROL SULFATE HFA 108 (90 BASE) MCG/ACT IN AERS: 2.0000 | INHALATION_SPRAY | Freq: Four times a day (QID) | RESPIRATORY_TRACT | 5 refills | Status: DC | PRN

## 2022-06-28 NOTE — Telephone Encounter (Signed)
Dr. Loanne Drilling and Dr. Lake Bells are you okay with patient making the switch please advise?

## 2022-06-28 NOTE — Telephone Encounter (Signed)
Spoke with patient was able to get him scheduled with Dr. Lake Bells for [place] request. Nothing further needed.

## 2022-06-28 NOTE — Telephone Encounter (Signed)
OK with me to switch. Albuterol refilled

## 2022-06-28 NOTE — Addendum Note (Signed)
Addended by: Margaretha Seeds on: 06/28/2022 11:16 AM   Modules accepted: Orders

## 2022-08-15 ENCOUNTER — Ambulatory Visit: Payer: Medicare Other | Admitting: Pulmonary Disease

## 2022-08-15 DIAGNOSIS — I498 Other specified cardiac arrhythmias: Secondary | ICD-10-CM | POA: Diagnosis not present

## 2022-08-15 DIAGNOSIS — R001 Bradycardia, unspecified: Secondary | ICD-10-CM | POA: Diagnosis not present

## 2022-08-16 ENCOUNTER — Ambulatory Visit
Admission: RE | Admit: 2022-08-16 | Discharge: 2022-08-16 | Disposition: A | Discharge status: Home / Self Care | Payer: Medicare Other | Source: Ambulatory Visit | Attending: Family Medicine | Admitting: Family Medicine

## 2022-08-16 ENCOUNTER — Other Ambulatory Visit (HOSPITAL_COMMUNITY)
Admission: RE | Admit: 2022-08-16 | Discharge: 2022-08-16 | Disposition: A | Discharge status: Home / Self Care | Payer: Medicare Other | Source: Ambulatory Visit | Attending: Interventional Radiology | Admitting: Interventional Radiology

## 2022-08-16 DIAGNOSIS — E041 Nontoxic single thyroid nodule: Secondary | ICD-10-CM | POA: Insufficient documentation

## 2022-08-18 LAB — CYTOLOGY - NON PAP

## 2022-08-22 ENCOUNTER — Ambulatory Visit: Payer: Medicare Other | Admitting: Pulmonary Disease

## 2022-08-30 NOTE — Progress Notes (Unsigned)
Patient referred by [name], MD for Ventricular Bigeminy and Bradycardia  Subjective:   Curtis [name], male    DOB: 09/22/1945, 77 y.o.   MRN: [id number]  *** No chief complaint on file.   *** HPI He  77 y.o. caucasian male with a PMH og COPD, prostate cancer s/p prostatectomy (2013), who present to the office to establish care and further cardiac evaluation for Ventricular Bigeminy and Bradycardia.   Patient recently was seen by PCP for evaluation of low heart rate noted by patients apple watch (40-80 bpm). He noticed this for about four days, and notable fatigue for several months. He denied any CP/SHOB/dizziness or lightheadedness. He does express the use of THC gummies and brownies and had some different THC products then his normal and did not know if this was contributing to his concerns for heart rate.   Today he ***   *** Past Medical History:  Diagnosis Date   Asthma    COPD (chronic obstructive pulmonary disease) (HCC)    DJD (degenerative joint disease)    of back   Prostate cancer (HCC)    S/P radiation therapy 08/12/2014 through 09/25/2014                                                      Prostate bed 6600 cGy in 33 sessions                         Shortness of breath    with exertion    *** Past Surgical History:  Procedure Laterality Date   2008 right achilles tendon surgery     CATARACT EXTRACTION Left 03/2018   CATARACT EXTRACTION Right 05/14/2018   CYSTOSCOPY  03/06/2012   Procedure: CYSTOSCOPY FLEXIBLE;  Surgeon: Valetta Fuller, MD;  Location: [facility];  Service: Urology;  Laterality: N/A;  with urethral dialation   ROBOT ASSISTED LAPAROSCOPIC RADICAL PROSTATECTOMY  03/06/2012   Procedure: ROBOTIC ASSISTED LAPAROSCOPIC RADICAL PROSTATECTOMY;  Surgeon: Valetta Fuller, MD;  Location: [facility];  Service: Urology;  Laterality: N/A;  WITH BILATERAL PELVIC LYMPH NODE DISSECTION   squamous cell areas removed       right wrist, scheduled for nov  2020 right calf   surgery for urethral stricture in the early 1990's at [facility]     TONSILECTOMY, ADENOIDECTOMY, BILATERAL MYRINGOTOMY AND TUBES  1955   URETHROTOMY N/A 02/21/2019   Procedure: DIRECT VISION INTERNAL URETHROTOMY POSSIBLE TRANSURETHRAL POSSIBLE RESECTION OF BLADDER TUMOR;  Surgeon: Crista Elliot, MD;  Location: [facility];  Service: Urology;  Laterality: N/A;    *** Social History   Tobacco Use  Smoking Status Former   Packs/day: 0.50   Years: 32.00   Additional pack years: 0.00   Total pack years: 16.00   Types: Cigarettes   Start date: 1963   Quit date: 05/15/1993   Years since quitting: 29.3  Smokeless Tobacco Never    Social History   Substance and Sexual Activity  Alcohol Use Yes   Comment: 2 glasses wine at night    *** Family History  Problem Relation Age of Onset   Cancer Father        prostate   Bipolar disorder Paternal Grandfather     ***  Current Outpatient Medications:  albuterol (VENTOLIN HFA) 108 (90 Base) MCG/ACT inhaler, Inhale 2 puffs into the lungs every 6 (six) hours as needed. For shortness of breath, Disp: 18 g, Rfl: 5   budesonide-formoterol (SYMBICORT) 160-4.5 MCG/ACT inhaler, Inhale 2 puffs into the lungs 2 (two) times daily., Disp: 10.2 each, Rfl: 11   Cardiovascular and other pertinent studies:  Reviewed external labs and tests, independently interpreted  *** EKG ***/***/202***: ***  ***  *** Recent labs: 05/30/2022: Glucose 96, BUN/Cr 20/1.13. EGFR 67. Na/K 139/5.0. ***Rest of the CMP normal H/H 15.2/46.2. MCV 89.9. Platelets 263 Chol ***, TG 133, HDL 50, LDL 120  08/15/2022 TSH 2.24 - normal   *** ROS      *** There were no vitals filed for this visit.   There is no height or weight on file to calculate BMI. There were no vitals filed for this visit.  *** Objective:   Physical Exam    ***     Visit diagnoses: No diagnosis found.   No orders of the defined types were  placed in this encounter.    Medication changes this visit: There are no discontinued medications.  No orders of the defined types were placed in this encounter.    Assessment & Recommendations:   ***  ***  Thank you for referring the patient to [place]. Please feel free to contact with any questions.   Elder Negus, MD Pager: [phone] Office: [phone]

## 2022-08-31 ENCOUNTER — Encounter: Payer: Self-pay | Admitting: Cardiology

## 2022-08-31 ENCOUNTER — Other Ambulatory Visit: Payer: Medicare Other

## 2022-08-31 ENCOUNTER — Ambulatory Visit: Payer: Medicare Other | Admitting: Cardiology

## 2022-08-31 VITALS — BP 147/54 | HR 38 | Resp 16 | Ht 68.5 in | Wt 203.0 lb

## 2022-08-31 DIAGNOSIS — R0609 Other forms of dyspnea: Secondary | ICD-10-CM

## 2022-08-31 DIAGNOSIS — R0989 Other specified symptoms and signs involving the circulatory and respiratory systems: Secondary | ICD-10-CM | POA: Diagnosis not present

## 2022-08-31 DIAGNOSIS — E785 Hyperlipidemia, unspecified: Secondary | ICD-10-CM | POA: Diagnosis not present

## 2022-08-31 DIAGNOSIS — E782 Mixed hyperlipidemia: Secondary | ICD-10-CM

## 2022-08-31 DIAGNOSIS — I498 Other specified cardiac arrhythmias: Secondary | ICD-10-CM

## 2022-09-13 DIAGNOSIS — I498 Other specified cardiac arrhythmias: Secondary | ICD-10-CM | POA: Diagnosis not present

## 2022-09-15 DIAGNOSIS — R002 Palpitations: Secondary | ICD-10-CM | POA: Diagnosis not present

## 2022-09-15 DIAGNOSIS — J208 Acute bronchitis due to other specified organisms: Secondary | ICD-10-CM | POA: Diagnosis not present

## 2022-09-15 DIAGNOSIS — R0989 Other specified symptoms and signs involving the circulatory and respiratory systems: Secondary | ICD-10-CM | POA: Diagnosis not present

## 2022-09-28 DIAGNOSIS — I498 Other specified cardiac arrhythmias: Secondary | ICD-10-CM | POA: Insufficient documentation

## 2022-09-28 NOTE — Progress Notes (Signed)
Patient referred by Curtis Gosselin, MD for Ventricular Bigeminy and Bradycardia  Subjective:   Curtis [name], male    DOB: August 01, 1945, 77 y.o.   MRN: [id number]   Chief Complaint  Patient presents with   Ventricular bigeminy   Follow-up    6 week    HPI  77 y.o. caucasian male with COPD, prostate cancer s/p prostatectomy (2013), asthma, hyperlipidemia, coronary and aortic calcification, PVC (38% burden), atrial tachycardia, NSVT  Patient does not have significant chest pain, shortness of breath, palpitations present this time.  He has made positive changes to his diet and lifestyle hoping to improve his lipid panel.  Initial consultation visit 08/2022: Patient is retired, lost his wife two years ago.Patient states he has been off/on steroids majority of his life. Patient recently was seen by PCP for evaluation of low heart rate noted by patients apple watch (40-80 bpm). He noticed this for about four days, and notable fatigue for several months. He denied any CP/dizziness or lightheadedness.He does admit to some shortness of breath. He states he walks with no problem and he plays golf and can make it through 18 holes with no issues. He has noticed but has been weaker with less energy.    He does express the use of THC gummies and brownies and had some different THC products then his normal and did not know if this was contributing to his concerns for heart rate. He admits to drinking 2 bottles of wine roughly a week.    Current Outpatient Medications:    albuterol (VENTOLIN HFA) 108 (90 Base) MCG/ACT inhaler, Inhale 2 puffs into the lungs every 6 (six) hours as needed. For shortness of breath, Disp: 18 g, Rfl: 5   budesonide-formoterol (SYMBICORT) 160-4.5 MCG/ACT inhaler, Inhale 2 puffs into the lungs 2 (two) times daily., Disp: 10.2 each, Rfl: 11   Cardiovascular and other pertinent studies:  Reviewed external labs and tests, independently interpreted  EKG 08/31/2022: Sinus  rhythm 71 bpm Frequent pvcs -ventricular bigeminy  Echocardiogram 10/03/2022: Normal LV systolic function with visual EF 55-60%. Left ventricle cavity is normal in size. Mild concentric remodeling of the left ventricle. Normal global wall motion. Indeterminate diastolic filling pattern. Calculated EF 54%. Left atrial cavity is mildly dilated. Trileaflet aortic valve. Trace aortic regurgitation. Mild aortic valve leaflet calcification. Trace mitral regurgitation. Mild calcification of the mitral valve annulus. Structurally normal tricuspid valve with no regurgitation. No evidence of pulmonary hypertension. No prior available for comparison.    ABI 10/03/2022: This exam reveals normal perfusion of the right lower extremity (ABI 1.30).   This exam reveals normal perfusion of the left lower extremity (ABI 1.16).  Normal triphasic waveform pattern at the level of the ankles.   Mobile cardiac telemetry 7 days 08/31/2022 - 09/07/2022: Dominant rhythm: Sinus. HR 49-124 bpm. Avg HR 62 bpm, in sinus rhythm. 25 episodes of atrial tachycardia, fastest at 179 bpm for 8 beats, longest for 26.2 secs at 108 bpm. <1% isolated SVE, couplets. 32 episodes of VT, fastest at 174 bpm for 4 beats, longest for 12 beats at 107 bpm. 38.2% isolated VE, <1% couplet/triplets. Ventricular bigeminy or trigeminy present. No atrial fibrillation/atrial flutter/high grade AV block, sinus pause >3sec noted. 3 patient triggered events, correlated with VE.  Recent labs: 05/30/2022: Glucose 96, BUN/Cr 20/1.13. EGFR 67. Na/K 139/5.0. H/H 15.2/46.2. MCV 89.9. Platelets 263 Chol 193, TG 133, HDL 50, LDL 120  08/15/2022 TSH 2.24 - normal   Review of Systems  Constitutional: Positive  for malaise/fatigue.  Cardiovascular:  Negative for chest pain, dyspnea on exertion, leg swelling, palpitations and syncope.  Respiratory:  Negative for shortness of breath.   Musculoskeletal: Negative.   Neurological: Negative.          Vitals:   10/12/22 1125  BP: (!) 142/80  Pulse: (!) 54  Resp: 16  SpO2: 97%     Body mass index is 29.95 kg/m. Filed Weights   10/12/22 1125  Weight: 197 lb (89.4 kg)     Objective:   Physical Exam Vitals and nursing note reviewed.  Constitutional:      General: He is not in acute distress. Neck:     Vascular: No JVD.  Cardiovascular:     Rate and Rhythm: Normal rate and regular rhythm. Frequent Extrasystoles are present.    Pulses: Decreased pulses.          Dorsalis pedis pulses are 0 on the right side and 0 on the left side.       Posterior tibial pulses are 1+ on the right side and 1+ on the left side.     Heart sounds: Normal heart sounds. No murmur heard.    No gallop.  Pulmonary:     Effort: Pulmonary effort is normal.     Breath sounds: Normal breath sounds. No wheezing or rales.  Musculoskeletal:     Right lower leg: No edema.     Left lower leg: No edema.  Skin:    General: Skin is warm and dry.  Neurological:     General: No focal deficit present.     Mental Status: He is alert.  Psychiatric:        Mood and Affect: Mood normal.        Visit diagnoses:   ICD-10-CM   1. Ventricular bigeminy  I49.8     2. Mixed hyperlipidemia  E78.2 Lipid panel    Lipid panel       Orders Placed This Encounter  Procedures   Lipid panel     Medication changes this visit: There are no discontinued medications.  Meds ordered this encounter  Medications   metoprolol succinate (TOPROL-XL) 25 MG 24 hr tablet    Sig: Take 1 tablet (25 mg total) by mouth daily. Take with or immediately following a meal.    Dispense:  90 tablet    Refill:  3     Assessment & Recommendations:   77 y.o. caucasian male with COPD, prostate cancer s/p prostatectomy (2013), asthma, hyperlipidemia, coronary and aortic calcification, PVC (38% burden), atrial tachycardia, NSVT  PVCs/NSVT: High PVC burden with brief NSVT. Normal EF, without significant symptoms. Recommend  control succinate 25 mg daily at this time. In future, if PVC burden does not reduce significantly, could consider EP referral. Even in absence of symptoms, I will repeat echocardiogram in 1 year that is 09/2023 to ensure EF remains normal.  Mixed hyperlipidemia: Patient prefers to make diet and lifestyle changes at this time, also. Repeat lipid panel months.   Elder Negus, MD Pager: [phone] Office: [phone]

## 2022-10-02 ENCOUNTER — Other Ambulatory Visit: Payer: Medicare Other

## 2022-10-03 ENCOUNTER — Ambulatory Visit: Payer: Medicare Other

## 2022-10-03 DIAGNOSIS — R0609 Other forms of dyspnea: Secondary | ICD-10-CM

## 2022-10-03 DIAGNOSIS — R0989 Other specified symptoms and signs involving the circulatory and respiratory systems: Secondary | ICD-10-CM

## 2022-10-12 ENCOUNTER — Ambulatory Visit: Payer: Medicare Other | Admitting: Cardiology

## 2022-10-12 ENCOUNTER — Encounter: Payer: Self-pay | Admitting: Cardiology

## 2022-10-12 VITALS — BP 142/80 | HR 54 | Resp 16 | Ht 68.0 in | Wt 197.0 lb

## 2022-10-12 DIAGNOSIS — I498 Other specified cardiac arrhythmias: Secondary | ICD-10-CM | POA: Diagnosis not present

## 2022-10-12 DIAGNOSIS — E782 Mixed hyperlipidemia: Secondary | ICD-10-CM

## 2022-10-12 MED ORDER — METOPROLOL SUCCINATE ER 25 MG PO TB24: 25.0000 mg | ORAL_TABLET | Freq: Every day | ORAL | 3 refills | Status: DC

## 2023-01-24 ENCOUNTER — Ambulatory Visit: Payer: Medicare Other | Admitting: Cardiology

## 2023-01-24 NOTE — Progress Notes (Deleted)
Patient referred by Jackelyn Poling, DO for Ventricular Bigeminy and Bradycardia  Subjective:   Curtis [name], male    DOB: 1946/04/24, 77 y.o.   MRN: [id number]   No chief complaint on file.   HPI  77 y.o. caucasian male with COPD, prostate cancer s/p prostatectomy (2013), asthma, hyperlipidemia, coronary and aortic calcification, PVC (38% burden), atrial tachycardia, NSVT  ***Patient does not have significant chest pain, shortness of breath, palpitations present this time.  He has made positive changes to his diet and lifestyle hoping to improve his lipid panel.  Initial consultation visit 08/2022: Patient is retired, lost his wife two years ago.Patient states he has been off/on steroids majority of his life. Patient recently was seen by PCP for evaluation of low heart rate noted by patients apple watch (40-80 bpm). He noticed this for about four days, and notable fatigue for several months. He denied any CP/dizziness or lightheadedness.He does admit to some shortness of breath. He states he walks with no problem and he plays golf and can make it through 18 holes with no issues. He has noticed but has been weaker with less energy.    He does express the use of THC gummies and brownies and had some different THC products then his normal and did not know if this was contributing to his concerns for heart rate. He admits to drinking 2 bottles of wine roughly a week.    Current Outpatient Medications:    albuterol (VENTOLIN HFA) 108 (90 Base) MCG/ACT inhaler, Inhale 2 puffs into the lungs every 6 (six) hours as needed. For shortness of breath, Disp: 18 g, Rfl: 5   budesonide-formoterol (SYMBICORT) 160-4.5 MCG/ACT inhaler, Inhale 2 puffs into the lungs 2 (two) times daily., Disp: 10.2 each, Rfl: 11   metoprolol succinate (TOPROL-XL) 25 MG 24 hr tablet, Take 1 tablet (25 mg total) by mouth daily. Take with or immediately following a meal., Disp: 90 tablet, Rfl: 3   Cardiovascular and  other pertinent studies:  Reviewed external labs and tests, independently interpreted  EKG 08/31/2022: Sinus rhythm 71 bpm Frequent pvcs -ventricular bigeminy  Echocardiogram 10/03/2022: Normal LV systolic function with visual EF 55-60%. Left ventricle cavity is normal in size. Mild concentric remodeling of the left ventricle. Normal global wall motion. Indeterminate diastolic filling pattern. Calculated EF 54%. Left atrial cavity is mildly dilated. Trileaflet aortic valve. Trace aortic regurgitation. Mild aortic valve leaflet calcification. Trace mitral regurgitation. Mild calcification of the mitral valve annulus. Structurally normal tricuspid valve with no regurgitation. No evidence of pulmonary hypertension. No prior available for comparison.    ABI 10/03/2022: This exam reveals normal perfusion of the right lower extremity (ABI 1.30).   This exam reveals normal perfusion of the left lower extremity (ABI 1.16).  Normal triphasic waveform pattern at the level of the ankles.   Mobile cardiac telemetry 7 days 08/31/2022 - 09/07/2022: Dominant rhythm: Sinus. HR 49-124 bpm. Avg HR 62 bpm, in sinus rhythm. 25 episodes of atrial tachycardia, fastest at 179 bpm for 8 beats, longest for 26.2 secs at 108 bpm. <1% isolated SVE, couplets. 32 episodes of VT, fastest at 174 bpm for 4 beats, longest for 12 beats at 107 bpm. 38.2% isolated VE, <1% couplet/triplets. Ventricular bigeminy or trigeminy present. No atrial fibrillation/atrial flutter/high grade AV block, sinus pause >3sec noted. 3 patient triggered events, correlated with VE.  Recent labs: 05/30/2022: Glucose 96, BUN/Cr 20/1.13. EGFR 67. Na/K 139/5.0. H/H 15.2/46.2. MCV 89.9. Platelets 263 Chol 193, TG 133, HDL  50, LDL 120  08/15/2022 TSH 2.24 - normal   Review of Systems  Constitutional: Positive for malaise/fatigue.  Cardiovascular:  Negative for chest pain, dyspnea on exertion, leg swelling, palpitations and syncope.   Respiratory:  Negative for shortness of breath.   Musculoskeletal: Negative.   Neurological: Negative.         There were no vitals filed for this visit.    There is no height or weight on file to calculate BMI. There were no vitals filed for this visit.    Objective:   Physical Exam Vitals and nursing note reviewed.  Constitutional:      General: He is not in acute distress. Neck:     Vascular: No JVD.  Cardiovascular:     Rate and Rhythm: Normal rate and regular rhythm. Frequent Extrasystoles are present.    Pulses: Decreased pulses.          Dorsalis pedis pulses are 0 on the right side and 0 on the left side.       Posterior tibial pulses are 1+ on the right side and 1+ on the left side.     Heart sounds: Normal heart sounds. No murmur heard.    No gallop.  Pulmonary:     Effort: Pulmonary effort is normal.     Breath sounds: Normal breath sounds. No wheezing or rales.  Musculoskeletal:     Right lower leg: No edema.     Left lower leg: No edema.  Skin:    General: Skin is warm and dry.  Neurological:     General: No focal deficit present.     Mental Status: He is alert.  Psychiatric:        Mood and Affect: Mood normal.        Visit diagnoses: No diagnosis found.    No orders of the defined types were placed in this encounter.    Medication changes this visit: There are no discontinued medications.  No orders of the defined types were placed in this encounter.    Assessment & Recommendations:   77 y.o. caucasian male with COPD, prostate cancer s/p prostatectomy (2013), asthma, hyperlipidemia, coronary and aortic calcification, PVC (38% burden), atrial tachycardia, NSVT  *** PVCs/NSVT: High PVC burden with brief NSVT. Normal EF, without significant symptoms. Recommend control succinate 25 mg daily at this time. In future, if PVC burden does not reduce significantly, could consider EP referral. Even in absence of symptoms, I will repeat  echocardiogram in 1 year that is 09/2023 to ensure EF remains normal.  Mixed hyperlipidemia: Patient prefers to make diet and lifestyle changes at this time, also. Repeat lipid panel months.   Elder Negus, MD Pager: [phone] Office: [phone]

## 2023-02-01 ENCOUNTER — Ambulatory Visit: Payer: Medicare Other | Admitting: Cardiology

## 2023-02-27 ENCOUNTER — Encounter (HOSPITAL_BASED_OUTPATIENT_CLINIC_OR_DEPARTMENT_OTHER): Payer: Self-pay | Admitting: Pulmonary Disease

## 2023-02-27 ENCOUNTER — Ambulatory Visit (HOSPITAL_BASED_OUTPATIENT_CLINIC_OR_DEPARTMENT_OTHER): Payer: Medicare Other | Admitting: Pulmonary Disease

## 2023-02-27 VITALS — BP 114/68 | HR 68 | Resp 16 | Ht 68.0 in | Wt 199.4 lb

## 2023-02-27 DIAGNOSIS — J4489 Other specified chronic obstructive pulmonary disease: Secondary | ICD-10-CM

## 2023-02-27 DIAGNOSIS — Z23 Encounter for immunization: Secondary | ICD-10-CM

## 2023-02-27 MED ORDER — SYMBICORT 160-4.5 MCG/ACT IN AERO: 2.0000 | INHALATION_SPRAY | Freq: Two times a day (BID) | RESPIRATORY_TRACT | 3 refills | Status: DC

## 2023-02-27 NOTE — Progress Notes (Signed)
Subjective:   PATIENT ID: Curtis [name] GENDER: male DOB: [birth year], MRN: [id number]   HPI  Chief Complaint  Patient presents with   Follow-up    Reason for Visit: Follow-up   Curtis [name] is 77 year old male former smoker with asthma-COPD overlap syndrome who presents for follow-up.  Synopsis: He is a former Dr. Kendrick Fries patient. Established care with me in 2020. He has been on Trelegy, Serevent, Stiolto Respimat and Symbicort. He did not tolerate Trelegy due to right sided throat irritation. Currently he is compliant on Symbicort with spacer. His asthma typically is triggered seasonally in the fall and spring and with illness. He is active and is able to play 16 rounds of golf at baseline.  02/02/22 Currently on Symbicort and occasionally has chest tightness and maybe some wheezing. Denies cough. Shortness of breath with moderate/heavy activity (dancing). Does not limit regular activity. HUses symbicort 1-2 puffs twice a day depending on how he feels. Currently able to play 18 holes of golf twice a month. Not exercising daily but tries to stay socially actively since his wife's death. Been using CBD.  Mar 08, 2023 Since our last visit he was doing fairly well on Symbicort and was feeling tired more quickly. Believes he had RSV in the spring time around April. Able to continue playing golf. Denies cough with occasional wheeze. He ran out of Symbicort one week ago and started to have more chest tightness. Uses albuterol 1-2 times a week normally.  Prior inhalers: Trelegy - not feeling well on this Advair - not as effective as Symbicort  Social History: Former smoker. Quit in 1995. Smokes MJ 1-2x a month. Now taking CBD   Past Medical History:  Diagnosis Date   Asthma    COPD (chronic obstructive pulmonary disease) (HCC)    DJD (degenerative joint disease)    of back   Prostate cancer (HCC)    S/P radiation therapy 08/12/2014 through 09/25/2014                                                       Prostate bed 6600 cGy in 33 sessions                         Shortness of breath    with exertion     Allergies  Allergen Reactions   Penicillins Other (See Comments)    "Negative feeling" Has patient had a PCN reaction causing immediate rash, facial/tongue/throat swelling, SOB or lightheadedness with hypotension: No Has patient had a PCN reaction causing severe rash involving mucus membranes or skin necrosis: No Has patient had a PCN reaction that required hospitalization: No Has patient had a PCN reaction occurring within the last 10 years: No If all of the above answers are "NO", then may proceed with Cephalosporin use.     Outpatient Medications Prior to Visit  Medication Sig Dispense Refill   albuterol (VENTOLIN HFA) 108 (90 Base) MCG/ACT inhaler Inhale 2 puffs into the lungs every 6 (six) hours as needed. For shortness of breath 18 g 5   metoprolol tartrate (LOPRESSOR) 25 MG tablet Take 25 mg by mouth 2 (two) times daily.     UNABLE TO FIND Med Name: TSH/CBD 10 mg gummy for pain     budesonide-formoterol (SYMBICORT) 160-4.5 MCG/ACT  inhaler Inhale 2 puffs into the lungs 2 (two) times daily. 10.2 each 11   metoprolol succinate (TOPROL-XL) 25 MG 24 hr tablet Take 1 tablet (25 mg total) by mouth daily. Take with or immediately following a meal. 90 tablet 3   No facility-administered medications prior to visit.    Review of Systems  Constitutional:  Negative for chills, diaphoresis, fever, malaise/fatigue and weight loss.  HENT:  Negative for congestion.   Respiratory:  Negative for cough, hemoptysis, sputum production, shortness of breath and wheezing.   Cardiovascular:  Negative for chest pain (chest tightness), palpitations and leg swelling.     Objective:   Vitals:   02/27/23 1445  BP: 114/68  Pulse: 68  Resp: 16  SpO2: 98%  Weight: 199 lb 6.4 oz (90.4 kg)  Height: 5\' 8"  (1.727 m)    SpO2: 98 %  Physical Exam: General: Well-appearing,  no acute distress HENT: [place], AT Eyes: EOMI, no scleral icterus Respiratory: Clear to auscultation bilaterally.  No crackles, wheezing or rales Cardiovascular: RRR, -M/R/G, no JVD Extremities:-Edema,-tenderness Neuro: AAO x4, CNII-XII grossly intact Psych: Normal mood, normal affect  Data Reviewed:  Imaging: CT Chest 03/21/16 - Interval resolution of LLL lung nodule and decreased size of LUL lung nodule.  PFT: 02/20/19 FVC 3.79 (92%) FEV1 2.32 (77%) Ratio 61  TLC 114% DLCO 123%. Significant bronchodilator response. Interpretation: Mild reversible obstructive defect present     Assessment & Plan:   Discussion: 77 year old male with asthma-COPD overlap syndrome who presents for follow-up. Last exacerbation in May 2023. Mild symptoms off inhaler. Not in exacerbation. Discussed clinical course and management of COPD/asthma including bronchodilator regimen, preventive care including vaccinations and action plan for exacerbation.   COPD-Asthma Overlap Syndrome - breakthrough symptoms off inhaler CONTINUE Symbicort 160-4.5 mcg TWO puffs TWICE a day CONTINUE Albuterol as needed for shortness of breath or wheezing Discussed vaccines: Influenza and RSV vaccine today  Health Maintenance Immunization History  Administered Date(s) Administered   Fluad Quad(high Dose 65+) 01/24/2021, 02/02/2022, 02/27/2023   Influenza Split 02/27/2016   Influenza, High Dose Seasonal PF 02/10/2017   Influenza,inj,Quad PF,6+ Mos 02/11/2015   Influenza-Unspecified 02/12/2012   Moderna Covid-19 Vaccine Bivalent Booster 47yrs & up 02/24/2021   PFIZER(Purple Top)SARS-COV-2 Vaccination 06/12/2019, 07/03/2019   PNEUMOCOCCAL CONJUGATE-20 02/02/2022   Pneumococcal Conjugate-13 02/27/2016   Pneumococcal-Unspecified 02/12/2012   Respiratory Syncytial Virus Vaccine,Recomb Aduvanted(Arexvy) 02/27/2023   CT Lung Screen - not qualified  Orders Placed This Encounter  Procedures   RSV,Recombinant PF (Arexvy)   Flu  Vaccine QUAD High Dose(Fluad)   Meds ordered this encounter  Medications   SYMBICORT 160-4.5 MCG/ACT inhaler    Sig: Inhale 2 puffs into the lungs 2 (two) times daily.    Dispense:  30.6 each    Refill:  3    Brand name only per insurance. Dispense 3 inhalers   Return in about 1 year (around 02/27/2024).  I have spent a total time of 30-minutes on the day of the appointment including chart review, data review, collecting history, coordinating care and discussing medical diagnosis and plan with the patient/family. Past medical history, allergies, medications were reviewed. Pertinent imaging, labs and tests included in this note have been reviewed and interpreted independently by me.  Tehilla Coffel Mechele Collin, MD [facility] 02/27/2023 3:25 PM  Office Number [phone]

## 2023-02-27 NOTE — Patient Instructions (Signed)
COPD-Asthma Overlap Syndrome - breakthrough symptoms off inhaler CONTINUE Symbicort 160-4.5 mcg TWO puffs TWICE a day CONTINUE Albuterol as needed for shortness of breath or wheezing Discussed vaccines: Influenza and RSV vaccine today

## 2023-05-23 ENCOUNTER — Encounter (HOSPITAL_BASED_OUTPATIENT_CLINIC_OR_DEPARTMENT_OTHER): Payer: Self-pay | Admitting: Pulmonary Disease

## 2023-05-24 MED ORDER — BREYNA 160-4.5 MCG/ACT IN AERO: 2.0000 | INHALATION_SPRAY | Freq: Two times a day (BID) | RESPIRATORY_TRACT | 1 refills | Status: AC

## 2023-06-12 DIAGNOSIS — I700 Atherosclerosis of aorta: Secondary | ICD-10-CM | POA: Diagnosis not present

## 2023-06-12 DIAGNOSIS — Z0000 Encounter for general adult medical examination without abnormal findings: Secondary | ICD-10-CM | POA: Diagnosis not present

## 2023-06-12 DIAGNOSIS — E041 Nontoxic single thyroid nodule: Secondary | ICD-10-CM | POA: Diagnosis not present

## 2023-06-12 DIAGNOSIS — Z8546 Personal history of malignant neoplasm of prostate: Secondary | ICD-10-CM | POA: Diagnosis not present

## 2023-06-12 DIAGNOSIS — L570 Actinic keratosis: Secondary | ICD-10-CM | POA: Diagnosis not present

## 2023-06-12 DIAGNOSIS — J449 Chronic obstructive pulmonary disease, unspecified: Secondary | ICD-10-CM | POA: Diagnosis not present

## 2023-06-28 DIAGNOSIS — E041 Nontoxic single thyroid nodule: Secondary | ICD-10-CM | POA: Diagnosis not present

## 2023-06-28 DIAGNOSIS — Z8546 Personal history of malignant neoplasm of prostate: Secondary | ICD-10-CM | POA: Diagnosis not present

## 2023-06-28 DIAGNOSIS — Z09 Encounter for follow-up examination after completed treatment for conditions other than malignant neoplasm: Secondary | ICD-10-CM | POA: Diagnosis not present

## 2023-06-28 DIAGNOSIS — I700 Atherosclerosis of aorta: Secondary | ICD-10-CM | POA: Diagnosis not present

## 2023-06-28 DIAGNOSIS — J449 Chronic obstructive pulmonary disease, unspecified: Secondary | ICD-10-CM | POA: Diagnosis not present

## 2023-07-17 ENCOUNTER — Other Ambulatory Visit (HOSPITAL_COMMUNITY): Payer: Self-pay

## 2023-07-17 ENCOUNTER — Telehealth: Payer: Self-pay

## 2023-07-17 NOTE — Telephone Encounter (Signed)
 Pharm calling. States the co-pay on the generic Symbicort is 35.00 but he will need a new RX or a call from [place]. TY.

## 2023-07-17 NOTE — Telephone Encounter (Signed)
 Pharmacy Patient Advocate Encounter   Received notification from CoverMyMeds that prior authorization for Breyna 160-4.5MCG/ACT aerosol is required/requested.   Insurance verification completed.   The patient is insured through [employer] Medicare .   Per test claim:  Budesonide-Formoterol ** is preferred by the insurance.  If suggested medication is appropriate, Please send in a new RX and discontinue this one. If not, please advise as to why it's not appropriate so that we may request a Prior Authorization. Please note, some preferred medications may still require a PA.  If the suggested medications have not been trialed and there are no contraindications to their use, the PA will not be submitted, as it will not be approved.   **Co-pay is $40.03 for [employer].

## 2023-07-18 ENCOUNTER — Other Ambulatory Visit (HOSPITAL_BASED_OUTPATIENT_CLINIC_OR_DEPARTMENT_OTHER): Payer: Self-pay

## 2023-07-18 MED ORDER — BUDESONIDE-FORMOTEROL FUMARATE 160-4.5 MCG/ACT IN AERO: 2.0000 | INHALATION_SPRAY | Freq: Two times a day (BID) | RESPIRATORY_TRACT | 11 refills | Status: AC

## 2023-07-18 NOTE — Telephone Encounter (Signed)
 Rx sent to pharmacy

## 2023-11-23 ENCOUNTER — Other Ambulatory Visit: Payer: Self-pay | Admitting: Pulmonary Disease

## 2023-12-07 DIAGNOSIS — E041 Nontoxic single thyroid nodule: Secondary | ICD-10-CM | POA: Diagnosis not present

## 2023-12-07 DIAGNOSIS — J0100 Acute maxillary sinusitis, unspecified: Secondary | ICD-10-CM | POA: Diagnosis not present

## 2024-01-04 DIAGNOSIS — E041 Nontoxic single thyroid nodule: Secondary | ICD-10-CM | POA: Diagnosis not present

## 2024-01-04 DIAGNOSIS — E042 Nontoxic multinodular goiter: Secondary | ICD-10-CM | POA: Diagnosis not present

## 2024-03-12 ENCOUNTER — Telehealth (HOSPITAL_BASED_OUTPATIENT_CLINIC_OR_DEPARTMENT_OTHER): Payer: Self-pay | Admitting: Pulmonary Disease

## 2024-03-12 NOTE — Telephone Encounter (Signed)
 Patient is due for his yearly f/u with Dr Kassie. Left voicemail instructing patient to schedule.
